# Patient Record
Sex: Female | Born: 1935 | Race: White | Hispanic: No | Smoking: Never smoker
Health system: Southern US, Community
[De-identification: ages and names within clinical notes are randomized; demographics above are authoritative.]

## PROBLEM LIST (undated history)

## (undated) DIAGNOSIS — E785 Hyperlipidemia, unspecified: Secondary | ICD-10-CM

## (undated) DIAGNOSIS — I10 Essential (primary) hypertension: Secondary | ICD-10-CM

---

## 2004-05-29 ENCOUNTER — Other Ambulatory Visit: Payer: Self-pay

## 2005-03-10 ENCOUNTER — Emergency Department: Payer: Self-pay | Admitting: Emergency Medicine

## 2015-08-19 DIAGNOSIS — E782 Mixed hyperlipidemia: Secondary | ICD-10-CM | POA: Insufficient documentation

## 2015-08-19 DIAGNOSIS — E785 Hyperlipidemia, unspecified: Secondary | ICD-10-CM | POA: Insufficient documentation

## 2016-08-19 DIAGNOSIS — Z0000 Encounter for general adult medical examination without abnormal findings: Secondary | ICD-10-CM | POA: Insufficient documentation

## 2020-01-24 ENCOUNTER — Ambulatory Visit: Payer: Self-pay | Attending: Internal Medicine

## 2020-01-24 DIAGNOSIS — Z20822 Contact with and (suspected) exposure to covid-19: Secondary | ICD-10-CM

## 2020-01-26 LAB — NOVEL CORONAVIRUS, NAA: SARS-CoV-2, NAA: NOT DETECTED

## 2020-03-23 ENCOUNTER — Emergency Department
Admission: EM | Admit: 2020-03-23 | Discharge: 2020-03-23 | Disposition: A | Discharge status: Home / Self Care | Payer: Medicare Other | Attending: Emergency Medicine | Admitting: Emergency Medicine

## 2020-03-23 ENCOUNTER — Emergency Department: Payer: Medicare Other

## 2020-03-23 ENCOUNTER — Other Ambulatory Visit: Payer: Self-pay

## 2020-03-23 DIAGNOSIS — M19031 Primary osteoarthritis, right wrist: Secondary | ICD-10-CM | POA: Diagnosis not present

## 2020-03-23 DIAGNOSIS — I10 Essential (primary) hypertension: Secondary | ICD-10-CM | POA: Diagnosis not present

## 2020-03-23 DIAGNOSIS — M25531 Pain in right wrist: Secondary | ICD-10-CM | POA: Insufficient documentation

## 2020-03-23 DIAGNOSIS — M25512 Pain in left shoulder: Secondary | ICD-10-CM | POA: Diagnosis present

## 2020-03-23 HISTORY — DX: Hyperlipidemia, unspecified: E78.5

## 2020-03-23 HISTORY — DX: Essential (primary) hypertension: I10

## 2020-03-23 NOTE — ED Triage Notes (Signed)
Patient reports putting up artificial christmas trees; reports she had to squeeze the lights/tree strenuously. Patient reports continued pain right hand since.

## 2020-03-23 NOTE — ED Notes (Signed)
pts wrist/hand wrapped with ace bandage. Pt very anxious about how she will manage care of husband with bandage, pt instructed and pt demonstrated proper technique for applying and removing bandage. Concerns addressed with MD Scotty Court and no new orders at this time

## 2020-03-23 NOTE — ED Provider Notes (Signed)
[facility] Emergency Department Provider Note  ____________________________________________  Time seen: Approximately 4:15 AM  I have reviewed the triage vital signs and the nursing notes.   HISTORY  Chief Complaint Hand Injury    HPI Rose Wilkins is a 84 y.o. female who complains of pain in the left shoulder and the right wrist.  She was in her usual state of health until 3 days ago with she was putting away some artificial Christmas trees.  This required repetitive movements to bend the wires and compact the decorations so that they could be put away in their box.   After putting them away, several hours later in the evening she started developing soreness in the right wrist and the left shoulder.  Denies any falls or acute injuries.  Denies fevers or chills.  Symptoms are constant, worse with movement, no alleviating factors.     Past Medical History:  Diagnosis Date  . Hyperlipidemia   . Hypertension      There are no problems to display for this patient.    History reviewed. No pertinent surgical history.   Prior to Admission medications   Not on File     Allergies Sulfa antibiotics   No family history on file.  Social History Social History   Tobacco Use  . Smoking status: Never Smoker  . Smokeless tobacco: Never Used  Substance Use Topics  . Alcohol use: Never  . Drug use: Not on file    Review of Systems  Constitutional:   No fever or chills.  ENT:   No sore throat. No rhinorrhea. Cardiovascular:   No chest pain or syncope. Respiratory:   No dyspnea or cough. Gastrointestinal:   Negative for abdominal pain, vomiting and diarrhea.  Musculoskeletal:   Right wrist pain and swelling as above.  Left shoulder pain. All other systems reviewed and are negative except as documented above in ROS and HPI.  ____________________________________________   PHYSICAL EXAM:  VITAL SIGNS: ED Triage Vitals  Enc Vitals Group    BP 03/23/20 0153 (!) 131/49     Pulse Rate 03/23/20 0153 79     Resp 03/23/20 0153 18     Temp 03/23/20 0153 97.6 F (36.4 C)     Temp Source 03/23/20 0153 Oral     SpO2 03/23/20 0153 97 %     Weight 03/23/20 0153 136 lb (61.7 kg)     Height 03/23/20 0153 5\' 6"  (1.676 m)     Head Circumference --      Peak Flow --      Pain Score 03/23/20 0157 2     Pain Loc --      Pain Edu? --      Excl. in [place]? --     Vital signs reviewed, nursing assessments reviewed.   Constitutional:   Alert and oriented. Non-toxic appearance. Eyes:   Conjunctivae are normal. EOMI. ENT      Head:   Normocephalic and atraumatic.      Mouth/Throat:   MMM      Neck:   No meningismus. Full ROM. Cardiovascular:   RRR. Symmetric bilateral radial and DP pulses.  Cap refill less than 2 seconds. Respiratory:   Normal respiratory effort without tachypnea/retractions. Musculoskeletal:   Normal range of motion in all extremities.  There is watery edema around the right wrist without inflammatory changes.  No wounds.  Phalen and Tinel sign not present, good capillary refill, soft compartments Neurologic:   Normal speech and  language.  Motor grossly intact. No acute focal neurologic deficits are appreciated.  Skin:    Skin is warm, dry and intact. No rash noted.  No wounds.  ____________________________________________    LABS (pertinent positives/negatives) (all labs ordered are listed, but only abnormal results are displayed) Labs Reviewed - No data to display ____________________________________________   EKG  ____________________________________________    RADIOLOGY  DG Hand Complete Right  Result Date: 03/23/2020 CLINICAL DATA:  First metacarpal pain and swelling with erythema. EXAM: RIGHT HAND - COMPLETE 3+ VIEW COMPARISON:  None. FINDINGS: There is narrowing of the first carpometacarpal joint with surrounding calcification/osteophytosis. The bones are generally osteopenic. There is no acute fracture  or dislocation. The other joint spaces are unremarkable. IMPRESSION: First carpometacarpal joint space narrowing with adjacent calcification/osteophytosis, likely indicating osteoarthritis. Electronically Signed   By: Deatra Robinson M.D.   On: 03/23/2020 02:41    ____________________________________________   PROCEDURES Procedures  ____________________________________________  CLINICAL IMPRESSION / ASSESSMENT AND PLAN / ED COURSE  Pertinent labs & imaging results that were available during my care of the patient were reviewed by me and considered in my medical decision making (see chart for details).  Rose Wilkins was evaluated in Emergency Department on 03/23/2020 for the symptoms described in the history of present illness. She was evaluated in the context of the global COVID-19 pandemic, which necessitated consideration that the patient might be at risk for infection with the SARS-CoV-2 virus that causes COVID-19. Institutional protocols and algorithms that pertain to the evaluation of patients at risk for COVID-19 are in a state of rapid change based on information released by regulatory bodies including the CDC and federal and state organizations. These policies and algorithms were followed during the patient's care in the ED.   Patient presents with pain and swelling of the right wrist after repetitive stress.  Consistent with arthritis/overuse.  Not consistent with infection or compartment syndrome.  Hand x-ray reviewed, unremarkable.  She is taking Aleve at home which she can continue.  Recommend Ace wrap, intermittent ice.      ____________________________________________   FINAL CLINICAL IMPRESSION(S) / ED DIAGNOSES    Final diagnoses:  Arthritis of right wrist     ED Discharge Orders    None      Portions of this note were generated with dragon dictation software. Dictation errors may occur despite best attempts at proofreading.   Sharman Cheek, MD 03/23/20  [phone]

## 2020-03-27 ENCOUNTER — Encounter: Payer: Self-pay | Admitting: *Deleted

## 2020-03-27 ENCOUNTER — Emergency Department: Payer: Medicare Other

## 2020-03-27 ENCOUNTER — Other Ambulatory Visit: Payer: Self-pay

## 2020-03-27 DIAGNOSIS — M79642 Pain in left hand: Secondary | ICD-10-CM | POA: Diagnosis present

## 2020-03-27 DIAGNOSIS — I10 Essential (primary) hypertension: Secondary | ICD-10-CM | POA: Insufficient documentation

## 2020-03-27 DIAGNOSIS — E871 Hypo-osmolality and hyponatremia: Secondary | ICD-10-CM | POA: Diagnosis not present

## 2020-03-27 DIAGNOSIS — L03114 Cellulitis of left upper limb: Secondary | ICD-10-CM | POA: Insufficient documentation

## 2020-03-27 DIAGNOSIS — J449 Chronic obstructive pulmonary disease, unspecified: Secondary | ICD-10-CM | POA: Insufficient documentation

## 2020-03-27 LAB — LACTIC ACID, PLASMA: Lactic Acid, Venous: 1.6 mmol/L (ref 0.5–1.9)

## 2020-03-27 MED ORDER — SODIUM CHLORIDE 0.9% FLUSH: 3.0000 mL | Freq: Once | INTRAVENOUS | Status: DC

## 2020-03-27 NOTE — ED Triage Notes (Signed)
Pt brought in by her daughter, seen at urgent care for redness and swelling to the left hand, states started after putting up christmas tree limbs last week. Notable redness extends up the left forearm, denies fevers. On lab review at [facility], pt sodium level 123, sent here for further eval (urine, bmp, and cbc labs sent with pt) Pt daughter says pt has been more confused and anxious lately than normal

## 2020-03-28 ENCOUNTER — Emergency Department
Admission: EM | Admit: 2020-03-28 | Discharge: 2020-03-28 | Disposition: A | Discharge status: Home / Self Care | Payer: Medicare Other | Attending: Emergency Medicine | Admitting: Emergency Medicine

## 2020-03-28 DIAGNOSIS — E871 Hypo-osmolality and hyponatremia: Secondary | ICD-10-CM

## 2020-03-28 DIAGNOSIS — L03114 Cellulitis of left upper limb: Secondary | ICD-10-CM

## 2020-03-28 LAB — BASIC METABOLIC PANEL
Anion gap: 9 (ref 5–15)
BUN: 22 mg/dL (ref 8–23)
CO2: 20 mmol/L — ABNORMAL LOW (ref 22–32)
Calcium: 7.9 mg/dL — ABNORMAL LOW (ref 8.9–10.3)
Chloride: 96 mmol/L — ABNORMAL LOW (ref 98–111)
Creatinine, Ser: 1.04 mg/dL — ABNORMAL HIGH (ref 0.44–1.00)
GFR calc Af Amer: 58 mL/min — ABNORMAL LOW (ref >60)
GFR calc non Af Amer: 50 mL/min — ABNORMAL LOW (ref >60)
Glucose, Bld: 102 mg/dL — ABNORMAL HIGH (ref 70–99)
Potassium: 4.1 mmol/L (ref 3.5–5.1)
Sodium: 125 mmol/L — ABNORMAL LOW (ref 135–145)

## 2020-03-28 MED ORDER — CEPHALEXIN 500 MG PO CAPS: 500.0000 mg | ORAL_CAPSULE | Freq: Four times a day (QID) | ORAL | 0 refills | Status: AC

## 2020-03-28 MED ORDER — CEPHALEXIN 500 MG PO CAPS
500.0000 mg | ORAL_CAPSULE | Freq: Once | ORAL | Status: AC
  Administered 2020-03-28: 01:00:00 500 mg via ORAL
  Filled 2020-03-28: qty 1

## 2020-03-28 MED ORDER — SODIUM CHLORIDE 0.9 % IV BOLUS
1000.0000 mL | Freq: Once | INTRAVENOUS | Status: AC
  Administered 2020-03-28: 1000 mL via INTRAVENOUS

## 2020-03-28 MED ORDER — DEXAMETHASONE SODIUM PHOSPHATE 10 MG/ML IJ SOLN
10.0000 mg | Freq: Once | INTRAMUSCULAR | Status: AC
  Administered 2020-03-28: 01:00:00 10 mg via INTRAVENOUS
  Filled 2020-03-28: qty 1

## 2020-03-28 NOTE — ED Provider Notes (Signed)
[facility] Emergency Department Provider Note  ____________________________________________   First MD Initiated Contact with Patient 03/28/20 0036     (approximate)  I have reviewed the triage vital signs and the nursing notes.   HISTORY  Chief Complaint Abnormal Labs    HPI ANAHLA BEVIS is a 84 y.o. female who presents from urgent care for further evaluation of left hand pain, redness, and swelling as well as abnormal labs at the urgent care.  Her daughter is with her at bedside and also provides history.  She reports that she has gradually developed the redness and swelling over the last week.  She was seen in this emergency department a few days ago for similar symptoms but without the redness on her right hand.  She was diagnosed with arthritis but then after she was last seen for the right hand pain she developed the symptoms in the left.  It is gotten notably worse over the last 24 to 48 hours.  The pain is mild but the redness and swelling extend about halfway up her forearm.  She denies fever, sore throat, nausea, vomiting, chest pain, shortness of breath, cough, and abdominal pain.  She has also had no dysuria.  She has had fatigue for the last few weeks and her daughter says it is because she is exhausting herself caring for the patient's spouse and not getting adequate sleep.  She also suffers from anxiety.  She has seen her primary care doctor, Dr. Dondra Prader , who has encouraged her to rest and prescribed her trazodone to help her sleep, but she is reluctant to take it because she wants to be available and ready to help her husband.  Of note, lab work including CBC, metabolic panel, and urinalysis were performed at the urgent care.  There were no significant abnormalities other than some proteinuria but most notably a sodium level of 123.  The last labs on record were from 6 months ago when she was only slightly decreased at 133.  She said it is  normal for her to be in the range of 133 but 123 is low for her.  Nothing in particular makes her symptoms better or worse and she admits to being quite anxious.  She did not have a puncture wound to her hand and has no laceration or other injury.  She has no history of gout.        Past Medical History:  Diagnosis Date  . Hyperlipidemia   . Hypertension     There are no problems to display for this patient.   History reviewed. No pertinent surgical history.  Prior to Admission medications   Medication Sig Start Date End Date Taking? Authorizing Provider  cephALEXin (KEFLEX) 500 MG capsule Take 1 capsule (500 mg total) by mouth 4 (four) times daily for 7 days. 03/28/20 04/04/20  Loleta Rose, MD    Allergies Sulfa antibiotics  No family history on file.  Social History Social History   Tobacco Use  . Smoking status: Never Smoker  . Smokeless tobacco: Never Used  Substance Use Topics  . Alcohol use: Never  . Drug use: Not on file    Review of Systems Constitutional: No fever/chills.  Fatigue but she denies generalized weakness.  Admits to insomnia or poor sleeping habits. Eyes: No visual changes. ENT: No sore throat. Cardiovascular: Denies chest pain. Respiratory: Denies shortness of breath. Gastrointestinal: No abdominal pain.  No nausea, no vomiting.  No diarrhea.  No constipation.  Genitourinary: Negative for dysuria. Musculoskeletal: Pain, swelling, and redness of the left hand extending about halfway up the forearm. Integumentary: Redness and swelling of left hand extending about halfway up the forearm. Neurological: Negative for headaches, focal weakness or numbness.   ____________________________________________   PHYSICAL EXAM:  VITAL SIGNS: ED Triage Vitals [03/27/20 1912]  Enc Vitals Group     BP (!) 172/77     Pulse Rate 75     Resp (!) 24     Temp 98.1 F (36.7 C)     Temp Source Oral     SpO2 99 %     Weight      Height      Head  Circumference      Peak Flow      Pain Score 4     Pain Loc      Pain Edu?      Excl. in [place]?     Constitutional: Alert and oriented.  Generally well-appearing and in no acute distress. Eyes: Conjunctivae are normal.  Head: Atraumatic. Nose: No congestion/rhinnorhea. Mouth/Throat: Patient is wearing a mask. Neck: No stridor.  No meningeal signs.   Cardiovascular: Normal rate, regular rhythm. Good peripheral circulation. Grossly normal heart sounds. Respiratory: Normal respiratory effort.  No retractions. Gastrointestinal: Soft and nontender. No distention.  Musculoskeletal: Right hand and arm appear normal.  The left hand is edematous and erythematous and the erythema and trace edema extend up to about halfway up the forearm, including about 1/4 to 1/3 of the limb overall but considerably less than 50%.  No significant tenderness to palpation, compartments are soft and easily compressible, no concern for flexor tenosynovitis, easily able to flex and extend her hand and fingers and wrist and has good grip strength.  No open wounds no evidence of wound infection. Neurologic:  Normal speech and language. No gross focal neurologic deficits are appreciated.  Skin:  Skin is warm, dry and intact.  See musculoskeletal exam above. Psychiatric: Mood and affect are normal. Speech and behavior are normal.  ____________________________________________   LABS (all labs ordered are listed, but only abnormal results are displayed)  Labs Reviewed  BASIC METABOLIC PANEL - Abnormal; Notable for the following components:      Result Value   Sodium 125 (*)    Chloride 96 (*)    CO2 20 (*)    Glucose, Bld 102 (*)    Creatinine, Ser 1.04 (*)    Calcium 7.9 (*)    GFR calc non Af Amer 50 (*)    GFR calc Af Amer 58 (*)    All other components within normal limits  LACTIC ACID, PLASMA   ____________________________________________  EKG  No indication for emergent  EKG ____________________________________________  RADIOLOGY I, Loleta Rose, personally viewed and evaluated these images (plain radiographs) as part of my medical decision making, as well as reviewing the written report by the radiologist.  ED MD interpretation: COPD on chest x-ray but no evidence of acute abnormality.  Marked first CMC degenerative changes on left hand x-rays without any other bony changes nor soft tissue concerns.  Official radiology report(s): DG Chest 2 View  Result Date: 03/27/2020 CLINICAL DATA:  Left hand infection. EXAM: CHEST - 2 VIEW COMPARISON:  None. FINDINGS: Lungs are hyperinflated. Mild, chronic appearing increased lung markings are seen without evidence of acute infiltrate, pleural effusion or pneumothorax. The heart size and mediastinal contours are within normal limits. There is mild calcification of the aortic arch. There is moderate severity  dextroscoliosis of the lower thoracic spine. IMPRESSION: 1. Findings consistent with COPD. 2. No acute or active cardiopulmonary disease. Electronically Signed   By: Aram Candela M.D.   On: 03/27/2020 20:38   DG Hand Complete Left  Result Date: 03/27/2020 CLINICAL DATA:  Hand tenderness and swelling. EXAM: LEFT HAND - COMPLETE 3+ VIEW COMPARISON:  March 23, 2020, contralateral hand. FINDINGS: Marked first CMC degenerative changes are similar to the contralateral hand. No signs of bony erosion or acute bone finding. Osteopenia. IMPRESSION: Marked first CMC degenerative changes likely osteoarthritis in similar to the contralateral hand. No acute findings. Electronically Signed   By: Donzetta Kohut M.D.   On: 03/27/2020 20:45    ____________________________________________   PROCEDURES   Procedure(s) performed (including Critical Care):  Procedures   ____________________________________________   INITIAL IMPRESSION / MDM / ASSESSMENT AND PLAN / ED COURSE  As part of my medical decision making, I reviewed the  following data within the electronic MEDICAL RECORD NUMBER History obtained from family, Nursing notes reviewed and incorporated, Labs reviewed , reviewed radiographs, EKG interpreted , Old chart reviewed and Notes from prior ED visits   Differential diagnosis includes, but is not limited to, hyponatremia due to volume depletion or other more chronic issue, acute infection such as cellulitis, gout, polyarticular arthritis.  The patient is overall generally well-appearing with stable vital signs.  She technically has a very mild leukocytosis of 10.3 and although this is just barely over the limit of normal, looking back through her records I see that her baseline is a white blood cell count around 5.  Her hemoglobin is a little bit lower than usual but she has no concerns for acute anemia.  Metabolic panel is normal other than her sodium of 123 which is decreased substantially from prior.  I suspect this is mostly volume related given the stress she is under, poor oral intake and sleeping habits, etc.  The patient does not want to stay in the hospital if it is not necessary and I think that would actually be worse for her than being able to be treated as an outpatient.  Although she may very well have a degree of cellulitis in the left hand and arm, she does not appear to need inpatient treatment and I discussed with the patient and her daughter the plan for empiric treatment of cellulitis to make sure it does not get worse if in fact that is what is going on with her.  They agree that that is appropriate to try.  I will give her a dose of Keflex 500 mg by mouth as well as a dose of Decadron 10 mg IV to help with the inflammation as well as to help proliferation of the cellulitis if in fact that is what is going on with her.  She does not have diabetes so hyperglycemia should not be a significant concern.  I am giving her 1 L normal saline IV bolus and then will check a metabolic panel to see if her sodium has come up.   I anticipate discharge and outpatient follow-up with Dr. Hyacinth Meeker but will reassess the patient after her lab work.      Clinical Course as of Mar 28 332  Sat Mar 28, 2020  4128 Patient sodium has come up slightly to 125.  It is still low but she is generally symptomatic from it.  I touch base with her and she very much wants to go home which I think is appropriate, particularly  since she has family that can help look after her.  I encouraged her to take a salt tablet daily (she said she already has some at home) and to eat and drink plenty of fluids and to not try to salt restrict herself.  She will follow up with Dr. [name] Heck on Monday.  I also went over with her again the need to take the antibiotics as prescribed and to take all her regular medications as scheduled this morning.  I gave strict return precautions and she understands and agrees with the plan.   [CF]    Clinical Course User Index [CF] Hinda Kehr, MD     ____________________________________________  FINAL CLINICAL IMPRESSION(S) / ED DIAGNOSES  Final diagnoses:  Left arm cellulitis  Hyponatremia     MEDICATIONS GIVEN DURING THIS VISIT:  Medications  sodium chloride flush (NS) 0.9 % injection 3 mL (has no administration in time range)  cephALEXin (KEFLEX) capsule 500 mg (500 mg Oral Given 03/28/20 0126)  sodium chloride 0.9 % bolus 1,000 mL (0 mLs Intravenous Stopped 03/28/20 0251)  dexamethasone (DECADRON) injection 10 mg (10 mg Intravenous Given 03/28/20 0126)     ED Discharge Orders         Ordered    cephALEXin (KEFLEX) 500 MG capsule  4 times daily     03/28/20 0330          *Please note:  JAYLYNN MCALEER was evaluated in Emergency Department on 03/28/2020 for the symptoms described in the history of present illness. She was evaluated in the context of the global COVID-19 pandemic, which necessitated consideration that the patient might be at risk for infection with the SARS-CoV-2 virus that causes  COVID-19. Institutional protocols and algorithms that pertain to the evaluation of patients at risk for COVID-19 are in a state of rapid change based on information released by regulatory bodies including the CDC and federal and state organizations. These policies and algorithms were followed during the patient's care in the ED.  Some ED evaluations and interventions may be delayed as a result of limited staffing during the pandemic.*  Note:  This document was prepared using Dragon voice recognition software and may include unintentional dictation errors.   Hinda Kehr, MD 03/28/20 [phone]

## 2020-03-28 NOTE — Discharge Instructions (Signed)
As we discussed, your work-up is generally reassuring.  We are treating you for a possible infection in your left hand and arm (cellulitis) with a course of antibiotics.  I encourage you to take it for the full 7 days (I originally told you it would be 10 days but I think we can give you a shorter course of 7 days and have it still be effective).  Take all of your regular medications as well starting with your morning doses today.  I also encourage you to be sure and drink plenty of fluids and eat a normal diet even if you do not have much of an appetite.  I also recommend you take one of your salt tablets daily.  Follow-up with Dr. Hyacinth Meeker on Monday for the next available appointment.  If you develop any new or worsening symptoms that concern you, please return immediately to the emergency department.

## 2020-10-19 ENCOUNTER — Ambulatory Visit (INDEPENDENT_AMBULATORY_CARE_PROVIDER_SITE_OTHER): Payer: Medicare Other | Admitting: Podiatry

## 2020-10-19 ENCOUNTER — Other Ambulatory Visit: Payer: Self-pay

## 2020-10-19 ENCOUNTER — Encounter: Payer: Self-pay | Admitting: Podiatry

## 2020-10-19 DIAGNOSIS — B351 Tinea unguium: Secondary | ICD-10-CM | POA: Diagnosis not present

## 2020-10-19 DIAGNOSIS — M79675 Pain in left toe(s): Secondary | ICD-10-CM

## 2020-10-19 DIAGNOSIS — I493 Ventricular premature depolarization: Secondary | ICD-10-CM | POA: Insufficient documentation

## 2020-10-19 DIAGNOSIS — I10 Essential (primary) hypertension: Secondary | ICD-10-CM | POA: Insufficient documentation

## 2020-10-19 DIAGNOSIS — M79674 Pain in right toe(s): Secondary | ICD-10-CM | POA: Diagnosis not present

## 2020-10-19 DIAGNOSIS — K2100 Gastro-esophageal reflux disease with esophagitis, without bleeding: Secondary | ICD-10-CM | POA: Insufficient documentation

## 2020-10-19 NOTE — Progress Notes (Signed)
This patient presents  to the office for evaluation and treatment of long thick painful big  nails .  This patient is unable to trim her own nails since the patient cannot reach her feet.  Patient says the nails are painful walking and wearing her shoes.  She returns for preventive foot care services.  General Appearance  Alert, conversant and in no acute stress.  Vascular  Dorsalis pedis and posterior tibial  pulses are palpable  bilaterally.  Capillary return is within normal limits  bilaterally. Temperature is within normal limits  bilaterally.  Neurologic  Senn-Weinstein monofilament wire test within normal limits  bilaterally. Muscle power within normal limits bilaterally.  Nails Thick disfigured discolored nails with subungual debris  Hallux  bilaterally. No evidence of bacterial infection or drainage bilaterally.  Orthopedic  No limitations of motion  feet .  No crepitus or effusions noted.  No bony pathology or digital deformities noted. HAV  B/L.  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  Midfoot  DJD  B/L.   Skin  normotropic skin with no porokeratosis noted bilaterally.  No signs of infections or ulcers noted.     Onychomycosis  Pain in toes right foot  Pain in toes left foot  Debridement  of hallux nails   B/L with a nail nipper.  Nails were then filed using a dremel tool with no incidents.    RTC  3 months    Helane Gunther DPM

## 2021-01-25 ENCOUNTER — Ambulatory Visit: Payer: Medicare Other | Admitting: Podiatry

## 2021-07-21 ENCOUNTER — Other Ambulatory Visit: Payer: Self-pay

## 2021-07-21 ENCOUNTER — Ambulatory Visit (INDEPENDENT_AMBULATORY_CARE_PROVIDER_SITE_OTHER): Payer: Medicare Other | Admitting: Licensed Clinical Social Worker

## 2021-07-21 DIAGNOSIS — F4323 Adjustment disorder with mixed anxiety and depressed mood: Secondary | ICD-10-CM

## 2021-07-21 DIAGNOSIS — Z634 Disappearance and death of family member: Secondary | ICD-10-CM

## 2021-07-21 NOTE — Progress Notes (Signed)
   THERAPIST PROGRESS NOTE  Session Time: 2-3p  Participation Level: Active  Behavioral Response: NeatAlertAnxious and Depressed  Type of Therapy: Individual Therapy  Treatment Goals addressed: Anxiety and Diagnosis: depression  Interventions: Other: Assessment and treatment plan  Summary: Rose Wilkins is a 85 y.o. female who presents with symptoms associated with adjustment disorder. Pt also reports that her symptoms have continued since husband's death in 04/11/2020.  Pt reports that overall mood fluctuates and that she gets good quality and quantity of sleep.  Pt reports a recent 30 lb weight loss since loss of husband. Pt reports that she feels she worries over everything. Pts daughter is good support system for her and they talk on a regular basis.   Pt wants to manage depression and anxiety symptoms better and process through thoughts and feelings associated with loss of husband and coping with physical limitations. Pt used to be a physical Automotive engineer and feels that she is unable to do many activities that have been therapeutic in the past.   Continued recommendations are as follows: self care behaviors, positive social engagements, focusing on overall work/home/life balance, and focusing on positive physical and emotional wellness.    Suicidal/Homicidal: Yes--pt expressed thoughts that sometimes she wishes she were not alive. Pt reiterates that she has no plans or intent on following through with taking her own life.   Therapist Response: Assessment/development of treatment plan. Provided pt with psychoeducational materials/coping skills for anxiety and depression.   Plan: Return again in 4 weeks.  Diagnosis: Axis I: Adjustment disorder with mixed anxiety and depressed mood; Bereavement.    Axis II: No diagnosis    Ernest Haber Nataya Bastedo, LCSW 07/21/2021

## 2021-08-25 ENCOUNTER — Ambulatory Visit (INDEPENDENT_AMBULATORY_CARE_PROVIDER_SITE_OTHER): Payer: Medicare Other | Admitting: Licensed Clinical Social Worker

## 2021-08-25 ENCOUNTER — Other Ambulatory Visit: Payer: Self-pay

## 2021-08-25 DIAGNOSIS — Z634 Disappearance and death of family member: Secondary | ICD-10-CM

## 2021-08-25 DIAGNOSIS — F4323 Adjustment disorder with mixed anxiety and depressed mood: Secondary | ICD-10-CM | POA: Diagnosis not present

## 2021-08-25 NOTE — Plan of Care (Signed)
  Problem: Depression CCP Problem  1 : Improve overall mood Intervention: Review PLEASE Skills (Treat Physical Illness, Balance Eating, Avoid Mood-Altering Substances, Balance Sleep and Get Exercise) with @PREFFIRSTNAME @

## 2021-08-25 NOTE — Plan of Care (Signed)
  Problem: Depression CCP Problem  1 : Improve overall mood Goal: LTG: @PREFFIRSTNAME @ will score less than 10 on the Patient Health Questionnaire (PHQ-9) Outcome: Progressing   Problem: Depression CCP Problem  1 : Improve overall mood Goal: STG: @PREFFIRSTNAME @ will participate in at least 80% of scheduled individual psychotherapy sessions Outcome: Progressing   Problem: Depression CCP Problem  1 : Improve overall mood Intervention: Review PLEASE Skills (Treat Physical Illness, Balance Eating, Avoid Mood-Altering Substances, Balance Sleep and Get Exercise) with @PREFFIRSTNAME @

## 2021-08-25 NOTE — Progress Notes (Signed)
   THERAPIST PROGRESS NOTE  Session Time: 1-2p  Participation Level: Active  Behavioral Response: Neat and Well GroomedAlertAnxious and Depressed  Type of Therapy: Individual Therapy  Treatment Goals addressed: Anxiety and Coping  Interventions: CBT and Supportive  Summary: Rose Wilkins is a 85 y.o. female who presents with continuing symptoms related to adjustment disorder diagnosis.  Pt reports that overall mood has been stable on most days but pt feels down on some days. Pt reports that she often feels she has low mood and low motivation. Pt feels that neuropathy in her hands keeps her from being able to do things that she typically enjoys. Discussed activities and things that pt enjoys. Pt feels that she is not engaging with others like she should "I don't want to hear the same thing over and over all the time". Pt reports that eating is difficult "opening cans with my hands is hard". Pt reports that PCP is managing psychiatric medication--encouraged pt to have a conversation with her PCP at next visit about mood and medication. Discussed relationships with daughter and sisters. Allowed daughter to come into session to discuss concerns about mother and discuss interventions that could be helpful. Daughter requests more frequent visits--will schedule visits in one month and will put on call list for virtual appt in between in person visits. Continued recommendations are as follows: self care behaviors, positive social engagements, focusing on overall work/home/life balance, and focusing on positive physical and emotional wellness.    Suicidal/Homicidal: No  Therapist Response: Soul is able to identify coping mechanisms that have been helpful in the past and increase its use. Pt is able to increase understanding of beliefs and messages that produce the worry and anxiety. Pt is continuing through her grief process/path. Pt is trying hard to develop the ability to recognize, accept, and cope  with feelings of depression. These behaviors are reflective of both personal growth and progress. Treatment to continue as indicated.  Plan: Return again in 4 weeks.  Diagnosis: Axis I: Adjustment disorder with mixed anxiety and depressed mood    Axis II: No diagnosis    Ernest Haber Leiani Enright, LCSW 08/25/2021

## 2021-08-25 NOTE — Plan of Care (Signed)
Developed plan 

## 2021-10-06 ENCOUNTER — Other Ambulatory Visit: Payer: Self-pay

## 2021-10-06 ENCOUNTER — Ambulatory Visit (INDEPENDENT_AMBULATORY_CARE_PROVIDER_SITE_OTHER): Payer: Medicare Other | Admitting: Licensed Clinical Social Worker

## 2021-10-06 DIAGNOSIS — F4323 Adjustment disorder with mixed anxiety and depressed mood: Secondary | ICD-10-CM

## 2021-10-06 NOTE — Progress Notes (Signed)
   THERAPIST PROGRESS NOTE  Session Time: 110-140p  Participation Level: Active  Behavioral Response: NeatAlertAnxious  Type of Therapy: Individual Therapy  Treatment Goals addressed:  Goal: LTG: @PREFFIRSTNAME @ will score less than 10 on the Patient Health Questionnaire (PHQ-9) Outcome: Progressing  Goal: STG: @PREFFIRSTNAME @ will participate in at least 80% of scheduled individual psychotherapy sessions Outcome: Progressing Interventions:  Intervention: REVIEW PLEASE SKILLS (TREAT PHYSICAL ILLNESS, BALANCE EATING, AVOID MOOD-ALTERING SUBSTANCES, BALANCE SLEEP AND GET EXERCISE) WITH [place]  Intervention: Discuss self-management skills  Intervention: Assist with coping skills and behavior  Intervention: Encourage changes to improve social interaction  Summary: DAINE CROKER is a 85 y.o. female who presents with improving symptoms related to adjustment disorder. Pt reports that overall mood is stable and that she is managing situational stressors well. Pt is reporting improvements in her overall hand neuropathy symptoms. Pt is taking lyrica and feels that it is starting to work better. Pt reports that she is engaging more socially--pt is going to bible studies on a regular basis and is getting out of the house more.  Pt reports that she is engaging with family members more--talking with daughter and sisters on a regular basis. Pt is excited about attending a wedding this coming weekend.   Discussed coping mechanisms for when pt feels depressed and/or anxious. Reviewed sleep hygiene.  Continued recommendations are as follows: self care behaviors, positive social engagements, focusing on overall work/home/life balance, and focusing on positive physical and emotional wellness.   Suicidal/Homicidal: No  Therapist Response: Pt is continuing to apply interventions learned in session into daily life situations. Pt is currently on track to meet goals utilizing interventions mentioned  above. Personal growth and progress noted. Treatment to continue as indicated.   Plan: Return again in 4 weeks.  Diagnosis: Axis I: Adjustment disorder with depressed mood    Axis II: No diagnosis    [place] Jahzara Slattery, LCSW 10/06/2021

## 2021-10-11 NOTE — Plan of Care (Signed)
  Problem: Depression CCP Problem  1 : Improve overall mood Goal: LTG: @PREFFIRSTNAME @ will score less than 10 on the Patient Health Questionnaire (PHQ-9) Outcome: Progressing Goal: STG: @PREFFIRSTNAME @ will participate in at least 80% of scheduled individual psychotherapy sessions Outcome: Progressing Intervention: Work with @PREFFIRSTNAME @ to identify the major components of a recent episode of depression: physical symptoms, major thoughts and images, and major behaviors they experienced Intervention: REVIEW PLEASE SKILLS (TREAT PHYSICAL ILLNESS, BALANCE EATING, AVOID MOOD-ALTERING SUBSTANCES, BALANCE SLEEP AND GET EXERCISE) WITH [place] Intervention: Discuss self-management skills Intervention: Assist with coping skills and behavior Intervention: Encourage changes to improve social interaction

## 2021-10-11 NOTE — Plan of Care (Signed)
  Problem: Depression CCP Problem  1 : Improve overall mood Goal: LTG: @PREFFIRSTNAME @ will score less than 10 on the Patient Health Questionnaire (PHQ-9) Outcome: Progressing Goal: STG: @PREFFIRSTNAME @ will participate in at least 80% of scheduled individual psychotherapy sessions Outcome: Progressing Intervention: Work with @PREFFIRSTNAME @ to identify the major components of a recent episode of depression: physical symptoms, major thoughts and images, and major behaviors they experienced

## 2021-11-17 ENCOUNTER — Ambulatory Visit (INDEPENDENT_AMBULATORY_CARE_PROVIDER_SITE_OTHER): Payer: Medicare Other | Admitting: Licensed Clinical Social Worker

## 2021-11-17 DIAGNOSIS — F4323 Adjustment disorder with mixed anxiety and depressed mood: Secondary | ICD-10-CM | POA: Diagnosis not present

## 2021-11-17 DIAGNOSIS — Z634 Disappearance and death of family member: Secondary | ICD-10-CM | POA: Diagnosis not present

## 2021-11-17 NOTE — Progress Notes (Signed)
Virtual Visit via Audio Note  I connected with Rose Wilkins on 11/17/21 at  2:00 PM EST by an audio enabled telemedicine application and verified that I am speaking with the correct person using two identifiers.  Location: Patient: home Provider: [facility]    I discussed the limitations of evaluation and management by telemedicine and the availability of in person appointments. The patient expressed understanding and agreed to proceed.    I discussed the assessment and treatment plan with the patient. The patient was provided an opportunity to ask questions and all were answered. The patient agreed with the plan and demonstrated an understanding of the instructions.   The patient was advised to call back or seek an in-person evaluation if the symptoms worsen or if the condition fails to improve as anticipated.  I provided 25 minutes of non-face-to-face time during this encounter.   Rose Freel R Ermagene Saidi, LCSW   THERAPIST PROGRESS NOTE  Session Time: 2-225p  Participation Level: Active  Behavioral Response: NeatAlertAnxious  Type of Therapy: Individual Therapy  Treatment Goals addressed:  Goal: LTG: @PREFFIRSTNAME @ will score less than 10 on the Patient Health Questionnaire (PHQ-9) Outcome: Progressing  Goal: STG: @PREFFIRSTNAME @ will participate in at least 80% of scheduled individual psychotherapy sessions Outcome: Progressing  Interventions:   Intervention: Educate patient on: Stress management Note: Reviewed stress management techniques  Intervention: Assist with relaxation techniques, as appropriate (deep breathing exercises, meditation, guided imagery) Note: Reviewed to help with pain management  Intervention: Assess emotional status and coping mechanisms Note: Assessed: pt reports that depression symptoms have decreased since last session even with recent injury    Summary: Rose Wilkins is a 85 y.o. female who presents with improving symptoms related to  adjustment disorder. Pt reports that overall mood is stable and that she is managing situational stressors well. Pt reports good quality and quantity of sleep.  Pt recently had an arm break while visiting with daughter in [place]. Discussed the entire scenario--pts hospital experience. Pt reports that once pain was controlled, she still was able to participate in activities scheduled and had a good time. Pt reports that her daughter stayed with her for a while to help out around the house but she had to go back home. Pt feels that there are lots of things that need to be done in the house that she can't do. Pt reports that her sister will be helping her with tasks.   Continued recommendations are as follows: self care behaviors, positive social engagements, focusing on overall work/home/life balance, and focusing on positive physical and emotional wellness.   Suicidal/Homicidal: No  Therapist Response: Pt is continuing to apply interventions learned in session into daily life situations. Pt is currently on track to meet goals utilizing interventions mentioned above. Personal growth and progress noted. Treatment to continue as indicated.   Plan: Return again in 4 weeks.  Diagnosis: Axis I: Adjustment disorder with depressed mood    Axis II: No diagnosis    91 Rose Flessner, LCSW 11/17/2021

## 2021-11-17 NOTE — Plan of Care (Signed)
  Problem: Depression CCP Problem  1 : Improve overall mood Goal: LTG: @PREFFIRSTNAME @ will score less than 10 on the Patient Health Questionnaire (PHQ-9) Outcome: Progressing Goal: STG: @PREFFIRSTNAME @ will participate in at least 80% of scheduled individual psychotherapy sessions Outcome: Progressing Intervention: Educate patient on: Stress management Note: Reviewed stress management techniques Intervention: Assist with relaxation techniques, as appropriate (deep breathing exercises, meditation, guided imagery) Note: Reviewed to help with pain management Intervention: Assess emotional status and coping mechanisms Note: Assessed: pt reports that depression symptoms have decreased since last session even with recent injury

## 2021-12-28 ENCOUNTER — Ambulatory Visit: Payer: Medicare Other | Admitting: Licensed Clinical Social Worker

## 2021-12-28 ENCOUNTER — Other Ambulatory Visit: Payer: Self-pay

## 2021-12-28 ENCOUNTER — Ambulatory Visit (INDEPENDENT_AMBULATORY_CARE_PROVIDER_SITE_OTHER): Payer: Medicare Other | Admitting: Licensed Clinical Social Worker

## 2021-12-28 DIAGNOSIS — Z634 Disappearance and death of family member: Secondary | ICD-10-CM

## 2021-12-28 DIAGNOSIS — F4323 Adjustment disorder with mixed anxiety and depressed mood: Secondary | ICD-10-CM | POA: Diagnosis not present

## 2021-12-28 NOTE — Progress Notes (Signed)
Virtual Visit via Audio Note  I connected with Rose Wilkins on 12/28/21 at  3:00 PM EST by an audio enabled telemedicine application and verified that I am speaking with the correct person using two identifiers.  Location: Patient: home Provider: [facility]    I discussed the limitations of evaluation and management by telemedicine and the availability of in person appointments. The patient expressed understanding and agreed to proceed.    I discussed the assessment and treatment plan with the patient. The patient was provided an opportunity to ask questions and all were answered. The patient agreed with the plan and demonstrated an understanding of the instructions.   The patient was advised to call back or seek an in-person evaluation if the symptoms worsen or if the condition fails to improve as anticipated.  I provided 25 minutes of non-face-to-face time during this encounter.   Rose Wilkins Rose Wilkins, Rose Wilkins   THERAPIST PROGRESS NOTE  Session Time: 2-225p  Participation Level: Active  Behavioral Response: NeatAlertAnxious  Type of Therapy: Individual Therapy  Treatment Goals addressed:  Goal: LTG: @PREFFIRSTNAME @ will score less than 10 on the Patient Health Questionnaire (PHQ-9) Outcome: Progressing  Goal: STG: @PREFFIRSTNAME @ will participate in at least 80% of scheduled individual psychotherapy sessions Outcome: Progressing  Interventions:   Intervention: Educate patient on: Stress management Note: Reviewed stress management techniques  Intervention: Assist with relaxation techniques, as appropriate (deep breathing exercises, meditation, guided imagery) Note: Reviewed to help with pain management  Intervention: Assess emotional status and coping mechanisms Note: Assessed: pt reports that depression symptoms have decreased since last session even with recent injury    Summary: Rose Wilkins is a 86 y.o. female who presents with improving symptoms related to  adjustment disorder. Pt reports that overall mood is stable and that she is managing situational stressors well. Pt reports good quality and quantity of sleep.  Allowed pt to explore and express thoughts and feelings associated with recent life situations and external stressors. Pt reports that her broken arm is the primary stressor in life right now. Pt feels more supported than at last session--pt states that her daughter and her sisters are her main support system.   Pt feels she has coping skills to manage anxiety and depression and elects to call back to continue counseling sessions PRN.   Continued recommendations are as follows: self care behaviors, positive social engagements, focusing on overall work/home/life balance, and focusing on positive physical and emotional wellness.   Suicidal/Homicidal: No  Therapist Response: Pt is continuing to apply interventions learned in session into daily life situations. Pt is currently on track to meet goals utilizing interventions mentioned above. Personal growth and progress noted. Treatment to continue as indicated.   Plan: Return again PRN  Diagnosis: Axis I: Adjustment disorder with depressed mood    Axis II: No diagnosis    Rose Wilkins, Rose Wilkins 12/28/2021

## 2022-06-07 ENCOUNTER — Emergency Department: Payer: Medicare Other

## 2022-06-07 ENCOUNTER — Emergency Department
Admission: EM | Admit: 2022-06-07 | Discharge: 2022-06-07 | Disposition: A | Discharge status: Home / Self Care | Payer: Medicare Other | Attending: Emergency Medicine | Admitting: Emergency Medicine

## 2022-06-07 DIAGNOSIS — G5791 Unspecified mononeuropathy of right lower limb: Secondary | ICD-10-CM | POA: Diagnosis not present

## 2022-06-07 DIAGNOSIS — D649 Anemia, unspecified: Secondary | ICD-10-CM | POA: Diagnosis not present

## 2022-06-07 DIAGNOSIS — M25471 Effusion, right ankle: Secondary | ICD-10-CM | POA: Diagnosis present

## 2022-06-07 DIAGNOSIS — N189 Chronic kidney disease, unspecified: Secondary | ICD-10-CM | POA: Diagnosis not present

## 2022-06-07 DIAGNOSIS — M21371 Foot drop, right foot: Secondary | ICD-10-CM

## 2022-06-07 DIAGNOSIS — G629 Polyneuropathy, unspecified: Secondary | ICD-10-CM

## 2022-06-07 DIAGNOSIS — R2689 Other abnormalities of gait and mobility: Secondary | ICD-10-CM | POA: Diagnosis present

## 2022-06-07 LAB — CBC WITH DIFFERENTIAL/PLATELET
Abs Immature Granulocytes: 0 10*3/uL (ref 0.00–0.07)
Basophils Absolute: 0.1 10*3/uL (ref 0.0–0.1)
Basophils Relative: 1 %
Eosinophils Absolute: 0.3 10*3/uL (ref 0.0–0.5)
Eosinophils Relative: 6 %
HCT: 36.1 % (ref 36.0–46.0)
Hemoglobin: 11.8 g/dL — ABNORMAL LOW (ref 12.0–15.0)
Immature Granulocytes: 0 %
Lymphocytes Relative: 33 %
Lymphs Abs: 1.6 10*3/uL (ref 0.7–4.0)
MCH: 31.6 pg (ref 26.0–34.0)
MCHC: 32.7 g/dL (ref 30.0–36.0)
MCV: 96.8 fL (ref 80.0–100.0)
Monocytes Absolute: 0.5 10*3/uL (ref 0.1–1.0)
Monocytes Relative: 11 %
Neutro Abs: 2.4 10*3/uL (ref 1.7–7.7)
Neutrophils Relative %: 49 %
Platelets: 209 10*3/uL (ref 150–400)
RBC: 3.73 MIL/uL — ABNORMAL LOW (ref 3.87–5.11)
RDW: 13.6 % (ref 11.5–15.5)
WBC: 4.9 10*3/uL (ref 4.0–10.5)
nRBC: 0 % (ref 0.0–0.2)

## 2022-06-07 LAB — COMPREHENSIVE METABOLIC PANEL
ALT: 13 U/L (ref 0–44)
AST: 23 U/L (ref 15–41)
Albumin: 3.8 g/dL (ref 3.5–5.0)
Alkaline Phosphatase: 64 U/L (ref 38–126)
Anion gap: 7 (ref 5–15)
BUN: 24 mg/dL — ABNORMAL HIGH (ref 8–23)
CO2: 23 mmol/L (ref 22–32)
Calcium: 9 mg/dL (ref 8.9–10.3)
Chloride: 108 mmol/L (ref 98–111)
Creatinine, Ser: 1.18 mg/dL — ABNORMAL HIGH (ref 0.44–1.00)
GFR, Estimated: 46 mL/min — ABNORMAL LOW (ref >60)
Glucose, Bld: 105 mg/dL — ABNORMAL HIGH (ref 70–99)
Potassium: 4.1 mmol/L (ref 3.5–5.1)
Sodium: 138 mmol/L (ref 135–145)
Total Bilirubin: 0.8 mg/dL (ref 0.3–1.2)
Total Protein: 7.1 g/dL (ref 6.5–8.1)

## 2022-06-07 LAB — VITAMIN B12: Vitamin B-12: 1189 pg/mL — ABNORMAL HIGH (ref 180–914)

## 2022-06-07 LAB — CK: Total CK: 121 U/L (ref 38–234)

## 2022-06-07 LAB — MAGNESIUM: Magnesium: 2.4 mg/dL (ref 1.7–2.4)

## 2022-06-07 NOTE — Evaluation (Signed)
Physical Therapy Evaluation Patient Details Name: Rose Wilkins MRN: [id number] DOB: [birth year] Today's Date: 06/07/2022  History of Present Illness  Rose Wilkins is an 15yoF who comes to [facility] ED after waking up with insidious onset Rt foot drop. Pt aeen by neurology who attributes this to acute peripheral nerve compression injury. Foot drop brace has been ordered and is on patient upon arrival.  Clinical Impression  Pt in bed awaiting PT evaluation, AFO donned and in place, adjusted by author. Educated pt on AFO use and safety. Pt able to AMB around ED unit and perform stairs education. Emphasized need to take extra safety precautions as home until foot drop resolves. Encouraged use of SPC.      Recommendations for follow up therapy are one component of a multi-disciplinary discharge planning process, led by the attending physician.  Recommendations may be updated based on patient status, additional functional criteria and insurance authorization.  Follow Up Recommendations Outpatient PT      Assistance Recommended at Discharge PRN  Patient can return home with the following       Equipment Recommendations Cane  Recommendations for Other Services       Functional Status Assessment Patient has had a recent decline in their functional status and demonstrates the ability to make significant improvements in function in a reasonable and predictable amount of time.     Precautions / Restrictions Precautions Precautions: Fall      Mobility  Bed Mobility Overal bed mobility: Independent                  Transfers Overall transfer level: Needs assistance Equipment used: None Transfers: Sit to/from Stand Sit to Stand: Min guard                Ambulation/Gait Ambulation/Gait assistance: Min guard Gait Distance (Feet): 600 Feet Assistive device: None Gait Pattern/deviations: Decreased dorsiflexion - right          Stairs            Wheelchair  Mobility    Modified Rankin (Stroke Patients Only)       Balance                                             Pertinent Vitals/Pain Pain Assessment Pain Assessment: No/denies pain    Home Living Family/patient expects to be discharged to:: Private residence Living Arrangements: Alone Available Help at Discharge: Family Type of Home: House Home Access: Stairs to enter Entrance Stairs-Rails: None Entrance Stairs-Number of Steps: 1-2   Home Layout: Laundry or work area in [place]: Kasandra Knudsen - single Barista (2 wheels)      Prior Function Prior Level of Function : Independent/Modified Independent;Driving                     Wellsite geologist Comments      Exercises Other Exercises Other Exercises: standing  Rt foot toe taps on gold wipes container 1x15   Assessment/Plan    PT Assessment All further PT needs can be met in the next venue of care  PT Problem List Decreased range of motion;Decreased strength;Decreased balance;Decreased coordination;Decreased knowledge of use of DME       PT Treatment Interventions      PT Goals (Current goals can be found in the Care Plan section)  Acute Rehab PT Goals Patient Stated Goal: not have foot drop PT Goal Formulation: With patient Time For Goal Achievement: 06/21/22 Potential to Achieve Goals: Poor    Frequency       Co-evaluation               AM-PAC PT "6 Clicks" Mobility  Outcome Measure Help needed turning from your back to your side while in a flat bed without using bedrails?: None Help needed moving from lying on your back to sitting on the side of a flat bed without using bedrails?: None Help needed moving to and from a bed to a chair (including a wheelchair)?: A Little Help needed standing  up from a chair using your arms (e.g., wheelchair or bedside chair)?: A Little Help needed to walk in hospital room?: A Little Help needed climbing 3-5 steps with a railing? : A Little 6 Click Score: 20    End of Session   Activity Tolerance: Patient tolerated treatment well;No increased pain Patient left: in bed;with call bell/phone within reach Nurse Communication: Mobility status PT Visit Diagnosis: Unsteadiness on feet (R26.81);Difficulty in walking, not elsewhere classified (R26.2)    Time: 9733-1250 PT Time Calculation (min) (ACUTE ONLY): 20 min   Charges:   PT Evaluation $PT Eval Moderate Complexity: 1 Mod PT Treatments $Gait Training: 8-22 mins       4:57 PM, 06/07/22 Etta Grandchild, PT, DPT Physical Therapist - [facility]  [phone] ([place])    Rulon Abdalla C 06/07/2022, 4:55 PM

## 2022-06-07 NOTE — Discharge Instructions (Addendum)
Please call your neurologist to make a follow-up appointment.  You will need to get an outpatient EMG.  Do not cross your legs and return to the ER if develop any other new symptoms or other concerns. Wear the brace to support the ankle with walking around.

## 2022-06-07 NOTE — ED Triage Notes (Signed)
Patient presents with RIGHT ankle swelling and pain after a fall; States that she got up to walk and "couldn't move" her RIGHT foot and fell; Denies LOC, dizziness; History of neuropathy, says she has a cane/walker that she is suppose to use but does not

## 2022-06-07 NOTE — Consult Note (Signed)
Neurology Consultation Reason for Consult: Leg weakness Referring Physician: Fuller Plan, M  CC: Leg weakness  History is obtained from: Patient  HPI: Rose Wilkins is a 86 y.o. female who made to bed in her normal state of health last night and when she awoke she had difficulty with her right foot.  She typically sleeps with her legs crossed.  She has a history of a peripheral neuropathy as well as bilateral carpal tunnel syndrome but has never had any symptoms such as the one that she presents with today.  She denies any diplopia, nausea, vomiting, weakness or numbness outside of her lower extremities, or other neurological symptoms.   Past Medical History:  Diagnosis Date   Hyperlipidemia    Hypertension      No family history on file.   Social History:  reports that she has never smoked. She has never used smokeless tobacco. She reports that she does not drink alcohol. No history on file for drug use.   Exam: Current vital signs: BP (!) 190/80   Pulse 61   Temp 98.3 F (36.8 C) (Oral)   Resp 19   Ht 5\' 6"  (1.676 m)   Wt 72.6 kg   SpO2 98%   BMI 25.82 kg/m  Vital signs in last 24 hours: Temp:  [98.3 F (36.8 C)] 98.3 F (36.8 C) (06/20 0713) Pulse Rate:  [61] 61 (06/20 0713) Resp:  [19] 19 (06/20 0713) BP: (190)/(80) 190/80 (06/20 0713) SpO2:  [98 %] 98 % (06/20 0713) Weight:  [72.6 kg] 72.6 kg (06/20 0713)   Physical Exam  Constitutional: Appears well-developed and well-nourished.   Neuro: Mental Status: Patient is awake, alert, oriented to person, place, month, year, and situation. Patient is able to give a clear and coherent history. No signs of aphasia or neglect Cranial Nerves: II: Visual Fields are full. Pupils are equal, round, and reactive to light.   III,IV, VI: EOMI without ptosis or diploplia.  V: Facial sensation is symmetric to temperature VII: Facial movement is symmetric.  VIII: hearing is intact to voice X: Uvula elevates symmetrically XI:  Shoulder shrug is symmetric. XII: tongue is midline without atrophy or fasciculations.  Motor: Tone is normal. Bulk is normal. 5/5 strength was present in all four extremities with the exception of dorsiflexion and eversion of the right foot which is severely diminished.  Sensory: Sensation is symmetric to light touch and temperature in the arms. In the legs, she has a decrease to light touch bilaterally just below the knee, with paresthesia. She has less paresthetic sensation(less tinglisness) on the lateral aspect of her right distal leg and foot than on the medial aspect. The paresthetic sensation is circumferentially the same on the left leg.  Deep Tendon Reflexes: 2+ and symmetric in the biceps and patellae. Absent at the ankles bilaterally.  Plantars: Toes are downgoing bilaterally.  Cerebellar: No ataxia   I have reviewed labs in epic and the results pertinent to this consultation are: Mild anemia with hgb of 11.8   Impression: 86 yo F with isolated right foot drop. She sleeps with her legs crossed, and I suspect a peroneal neuropathy. She has no back pain or tenderness to suggest herniated disc. She has no associated symptoms or signs to suggest a central cause. At this point, I would favor getting her an AFO brace and next steps diagnostically would be an EMG which would have to be done as outpatient.   Recommendations: 1) EMG/NCS  2) AFO brace and consider  PT eval 3) stop crossing legs.  4) outpatient neurology follow up.    Ritta Slot, MD [facility] [phone]  If 7pm- 7am, please page neurology on call as listed in [facility].

## 2022-06-07 NOTE — Progress Notes (Signed)
Orthopedic Tech Progress Note Patient Details:  Rose Wilkins 03-20-1936 [id number]  [facility]  needed a little more information about patient.  Patient ID: Rose Wilkins, female   DOB: 08/23/1936, 86 y.o.   MRN: [id number]  Donald Pore 06/07/2022, 9:49 AM

## 2022-06-07 NOTE — ED Provider Notes (Signed)
[facility] Provider Note    Event Date/Time   First MD Initiated Contact with Patient 06/07/22 0719     (approximate)   History   Ankle Injury (Patient presents with RIGHT ankle swelling and pain after a fall; States that she got up to walk and "couldn't move" her RIGHT foot and fell; Denies LOC, dizziness; History of neuropathy, says she has a cane/walker that she is suppose to use but does not)   HPI  Rose Wilkins is a 86 y.o. female with CKD, depression, B12 deficiency, hyperlipidemia who comes in with concern for right ankle difficulty moving.  Patient reports that she went to bed last night at 11:30 PM on 06/06/2022.  She reports that she woke up in the morning and she had inability to dorsiflex her right ankle.  She denies ever having this problem previously.  She reports that she tried to take some few steps and she noticed that it was not working and she almost fell but denies actually falling down onto the ground.  She is supposed to use a cane and walker to ambulate at home but does not typically use it.  She denies ever having this happen previously.  She denies any slurred speech, weakness anywhere else, vision changes or other issues.   Physical Exam   Triage Vital Signs: ED Triage Vitals  Enc Vitals Group     BP 06/07/22 0713 (!) 190/80     Pulse Rate 06/07/22 0713 61     Resp 06/07/22 0713 19     Temp 06/07/22 0713 98.3 F (36.8 C)     Temp Source 06/07/22 0713 Oral     SpO2 06/07/22 0713 98 %     Weight 06/07/22 0713 160 lb (72.6 kg)     Height 06/07/22 0713 5\' 6"  (1.676 m)     Head Circumference --      Peak Flow --      Pain Score 06/07/22 0714 0     Pain Loc --      Pain Edu? --      Excl. in GC? --     Most recent vital signs: Vitals:   06/07/22 0713  BP: (!) 190/80  Pulse: 61  Resp: 19  Temp: 98.3 F (36.8 C)  SpO2: 98%     General: Awake, no distress.  CV:  Good peripheral perfusion.  Resp:  Normal effort.   Abd:  No distention.  Other:  Cranial nerves II through XII are intact although patient does have right foot drop noted with good distal pulse.  No obvious swelling noted   ED Results / Procedures / Treatments   Labs (all labs ordered are listed, but only abnormal results are displayed) Labs Reviewed  CBC WITH DIFFERENTIAL/PLATELET  COMPREHENSIVE METABOLIC PANEL  MAGNESIUM  CK  VITAMIN B12       RADIOLOGY I have reviewed the xray personally and interpreted and there is no evidence of any fracture  PROCEDURES:  Critical Care performed: No  Procedures   MEDICATIONS ORDERED IN ED: Medications - No data to display   IMPRESSION / MDM / ASSESSMENT AND PLAN / ED COURSE  I reviewed the triage vital signs and the nursing notes.   Patient's presentation is most consistent with acute presentation with potential threat to life or bodily function.   Differential includes stroke, electrolyte abnormality, B12 deficiency, severe neuropathy.  We will proceed with CT imaging to evaluate for head or spinal injuries and basic  labs then discuss further with neurology  CT imaging, x-ray were reassuring CBC with slightly low hemoglobin but normal white count CMP shows slightly elevated creatinine but last check was 2 years ago was also slightly elevated CK normal Bladder scan re-assuring. No new urinary symptoms  Consulted Dr. Amada Jupiter from neurology given sudden onset of foot drop to decide if we needed any additional MRIs.  Patient has isolated foot drop and has a history of peripheral neuropathies.  She is got no back pain or urinary issues to suggest cord compression.  He recommends AFO brace and we can see if PT can see her to see how she does walking to see if she is safe for discharge but will need follow-up outpatient with neurology for EMG  Discussed with patient that if she is having difficulty ambulating that we can always consider admission for SNF placement but patient is  very adamant that she would like to try to go home after PT evaluation  The patient is on the cardiac monitor to evaluate for evidence of arrhythmia and/or significant heart rate changes.      FINAL CLINICAL IMPRESSION(S) / ED DIAGNOSES   Final diagnoses:  Right foot drop  Neuropathy     Rx / DC Orders   ED Discharge Orders     None        Note:  This document was prepared using Dragon voice recognition software and may include unintentional dictation errors.   Concha Se, MD 06/07/22 1040

## 2022-10-31 NOTE — Progress Notes (Signed)
 No chief complaint on file.   HPI  Rose Wilkins is a 86 y.o. here for an acute issue.  She has a PMH of HTN, HLD, neuropathy anemia, GERD, DDD cervical, who reports falling a few days ago landing on her right side.  Suffers pain to the right mid back and pain is worse with movement of the right arm.  No bruising.  No shortness of breath.    ROS  Pertinent items are noted in HPI.  Outpatient Encounter Medications as of 10/31/2022  Medication Sig Dispense Refill  . acetaminophen (TYLENOL) 500 MG tablet Take 1,000 mg by mouth 2 (two) times daily    . DULoxetine (CYMBALTA) 20 MG DR capsule Take 1 capsule (20 mg total) by mouth once daily 90 capsule 3  . ibuprofen (MOTRIN) 600 MG tablet Take 1 tablet (600 mg total) by mouth every 8 (eight) hours as needed for Pain 60 tablet 1  . lansoprazole (PREVACID) 30 MG DR capsule Take 30 mg by mouth once daily.    [name] MAGNESIUM ORAL Take 1 tablet by mouth once daily.    . pregabalin (LYRICA) 25 MG capsule TAKE 1 CAPSULE BY MOUTH TWO TIMES DAILY 60 capsule 5  . telmisartan (MICARDIS) 40 MG tablet Take 40 mg by mouth once daily    . triamcinolone 0.1 % cream APPLY TO AFFECTED AREA TWICE A DAY 45 g 3   No facility-administered encounter medications on file as of 10/31/2022.    Allergies as of 10/31/2022 - Reviewed 09/21/2022  Allergen Reaction Noted  . Gabapentin Rash 05/31/2021  . Zoloft [sertraline] Diarrhea 02/21/2020  . Sulfa (sulfonamide antibiotics) Rash 08/06/2014    Past Medical History:  Diagnosis Date  . Anemia, unspecified   . DDD (degenerative disc disease), cervical   . Essential hypertension, benign   . Exostosis of unspecified site   . Iron deficiency anemia, unspecified   . Other and unspecified hyperlipidemia   . Postmenopausal state   . PVC's (premature ventricular contractions)   . Reflux esophagitis     Past Surgical History:  Procedure Laterality Date  . ENDOSCOPIC CARPAL TUNNEL RELEASE Right 09/09/2020   Dr. Kathlynn     Vitals:   10/31/22 1413  BP: (!) 140/78  Pulse: 82    Physical Exam  General. Well appearing; NAD; VS reviewed     HEENT: Sclera and conjunctiva clear; EOMI,  Neck. Supple. Lungs. Respirations unlabored; clear to auscultation bilaterally. Cardiovascular. Heart regular rate and rhythm without murmurs, gallops, or rubs. Right shoulder: See below. Skin. Normal color and turgor Neurologic. Alert and oriented x3   Assessment and Plan 1. Mid back pain on right side She does have pain aggravated by shoulder movement and has positive supraspinatus test.  X-ray first of the ribs.  She will continue ibuprofen, Tylenol for now. -     X-ray ribs left with PA chest 3 plus views; Future -     ibuprofen (MOTRIN) 600 MG tablet; Take 1 tablet (600 mg total) by mouth every 8 (eight) hours as needed for Pain  2.  History of closed displaced fracture of proximal end of right humerus,    3. Acute pain of right shoulder Possible rotator cuff injury as well.  If not improving can consult orthopedics.  4. Essential hypertension, benign Has been taking Micardis since the injury for elevated blood pressure.    I have personally performed this service.  [address] [place], [place]

## 2022-12-22 NOTE — Procedures (Signed)
 After time out occurred and verbal informed consent obtained, 1cc Marcaine and 1.5cc Kenalog were injected under sterile conditions as a trigger point injection into the right pyriformis area without complication.

## 2022-12-22 NOTE — Progress Notes (Signed)
 Patient Profile:   Rose Wilkins  is a 87 y.o.  female Chief Complaint  Patient presents with  . Hip Pain    Right hip pain since December third. Noticed after doing an exercise that PT recommended. States she heard  a sound when she did this.  Did have a fall also in November.  . Bleeding/Bruising    Has noticed on her back. Also has  a bump  on back.      PROBLEM LIST: Past Medical History:  Diagnosis Date  . Anemia, unspecified   . DDD (degenerative disc disease), cervical   . Essential hypertension, benign   . Exostosis of unspecified site   . Iron deficiency anemia, unspecified   . Other and unspecified hyperlipidemia   . Postmenopausal state   . PVC's (premature ventricular contractions)   . Reflux esophagitis     Past Surgical History:  Procedure Laterality Date  . ENDOSCOPIC CARPAL TUNNEL RELEASE Right 09/09/2020   Dr. Kathlynn    ALLERGIES: Allergies  Allergen Reactions  . Gabapentin Rash  . Zoloft [Sertraline] Diarrhea  . Sulfa (Sulfonamide Antibiotics) Rash    Onset 07/31/1999.    CURRENT MEDICATIONS: Current Outpatient Medications: acetaminophen (TYLENOL) 500 MG tablet, Take 1,000 mg by mouth 2 (two) times daily, Taking cefUROXime (CEFTIN) 250 MG tablet, Take 1 tablet (250 mg total) by mouth 2 (two) times daily for 7 days, Taking DULoxetine (CYMBALTA) 20 MG DR capsule, Take 1 capsule (20 mg total) by mouth once daily, Taking ibuprofen (MOTRIN) 600 MG tablet, Take 1 tablet (600 mg total) by mouth every 8 (eight) hours as needed for Pain, PRN Not Currently Taking lansoprazole (PREVACID) 30 MG DR capsule, Take 30 mg by mouth once daily., Taking MAGNESIUM ORAL, Take 1 tablet by mouth once daily., Taking pregabalin (LYRICA) 25 MG capsule, TAKE 1 CAPSULE BY MOUTH TWO TIMES DAILY, Taking telmisartan (MICARDIS) 40 MG tablet, Take 40 mg by mouth once daily, Taking triamcinolone 0.1 % cream, APPLY TO AFFECTED AREA TWICE A  DAY, Taking    HPI   CLINICAL SUMMARY:  Patient fell out of her deck in November, landing on her right side, fracturing a rib.  She has a lump there which is slowly going down and has some ecchymoses tracking down into her buttock area.  She was doing some physical therapy and felt a pop and has had pain wrapping around into her right anterior thigh.  Worse with rotation and movement, minimal issues walking.  ROS: Review of systems is unremarkable for any active cardiac, respiratory, GI, GU, hematologic, neurologic, dermatologic, HEENT, or psychiatric symptoms except as noted above, 10 systems reviewed.  No fevers, chills, or constitutional symptoms.   PHYSICAL EXAM  Vital signs:  BP (!) 140/78 (BP Location: Left upper arm, Patient Position: Sitting, BP Cuff Size: Adult)   Pulse 95   Wt 76.5 kg (168 lb 9.6 oz)   LMP  (LMP Unknown)   SpO2 96%   BMI 27.21 kg/m  Body mass index is 27.21 kg/m.   Wt Readings from Last 3 Encounters:  12/22/22 76.5 kg (168 lb 9.6 oz)  10/31/22 77.1 kg (170 lb)  09/21/22 75.4 kg (166 lb 3.2 oz)     BP Readings from Last 3 Encounters:  12/22/22 (!) 140/78  10/31/22 (!) 140/78  09/21/22 130/70    Constitutional:NAD Neck: supple,  no thyromegaly, good ROM Respiratory:clear to auscultation, no rales or wheezes Cardiovascular:RRR, no murmur or gallop Abdominal:soft, good BS, NT Ext: no edema, good peripheral pulses, right chest wall hematoma, right focal piriformis tenderness Neuro: alert and oriented X 3, grossly nonfocal     ASSESSMENT/PLAN   Right chest wall hematoma-tracking distally, slowly getting better Right lumbar radiculitis-more of a L2, L3 area, long track into the right anterior thigh, right performers trigger point shot given, continue ibuprofen as needed  No sign for hip issue, not trochanteric Depression overall better Overall doing better  Dispo:   No follow-ups on file.

## 2023-10-18 NOTE — Progress Notes (Signed)
 ANNUAL EXAM  Rose Wilkins is a 87 y.o. female  CHIEF COMPLAINT: Chief Complaint  Patient presents with  . Annual Exam    Two recent falls. The second time she fell, she struck her face. Did not have evaluated. Remains tender. Unsure if she has visual changes.   Questioning if PT in home would be beneficial.    SUBJECTIVE:  Patient is fallen a couple times, working on the yard.  Gets discouraged that it takes her so long to get ready that she is not as social.  Feels very fatigued. ______________________________________________________________________ A comprehensive ROS was negative in all 10 systems reviewed.  ALLERGIES: Gabapentin, Zoloft [sertraline], and Sulfa (sulfonamide antibiotics)  Past Medical History:  Diagnosis Date  . Anemia, unspecified   . DDD (degenerative disc disease), cervical   . Essential hypertension, benign   . Exostosis of unspecified site   . Iron deficiency anemia, unspecified   . Other and unspecified hyperlipidemia   . Postmenopausal state   . PVC's (premature ventricular contractions)   . Reflux esophagitis     Past Surgical History:  Procedure Laterality Date  . ENDOSCOPIC CARPAL TUNNEL RELEASE Right 09/09/2020   Dr. Kathlynn    Current Outpatient Medications  Medication Sig Dispense Refill  . acetaminophen (TYLENOL) 500 MG tablet Take 500 mg by mouth 2 (two) times daily    . CRANBERRY ORAL Take by mouth One daily    . MAGNESIUM ORAL Take 1 tablet by mouth once daily NOT EVERY DAY.    . naproxen sodium (ALEVE) 220 MG tablet Take 220 mg by mouth 2 (two) times daily with meals    . omeprazole magnesium (PRILOSEC OTC ORAL) Take by mouth One daily    . pregabalin (LYRICA) 25 MG capsule take 1 capsule by mouth two times daily 60 capsule 5  . DULoxetine (CYMBALTA) 30 MG DR capsule Take 1 capsule (30 mg total) by mouth once daily 90 capsule 3  . triamcinolone 0.1 % cream APPLY TO AFFECTED AREA TWICE A DAY (Patient not taking: Reported on  10/18/2023) 45 g 3   Current Facility-Administered Medications  Medication Dose Route Frequency Provider Last Rate Last Admin  . cyanocobalamin (VITAMIN B12) injection 1,000 mcg  1,000 mcg Intramuscular Once Cleotilde Oneil Novel, MD        PHYSICAL EXAM: BP (!) 140/80   Pulse 102   Ht 167.6 cm (5' 6)   Wt 75.4 kg (166 lb 3.2 oz)   LMP  (LMP Unknown)   SpO2 95%   BMI 26.83 kg/m  Body mass index is 26.83 kg/m.  Wt Readings from Last 3 Encounters:  10/18/23 75.4 kg (166 lb 3.2 oz)  08/08/23 76.8 kg (169 lb 6.4 oz)  04/06/23 75.8 kg (167 lb)     BP Readings from Last 3 Encounters:  10/18/23 (!) 140/80  08/08/23 (!) 140/80  12/22/22 (!) 140/78    General: Alert oriented x3  Skin: No suspicious lesions or moles.   Eyes: Sclera and conjunctiva clear; pupils equal round and reactive to light and accommodation; extraocular movements intact Ears: External ears and canal normal; tympanic membranes normal.   Nose: Mucosa healthy without drainage or ulceration Oropharynx: No suspicious lesions Neck: No swelling, masses, stiffness, pain, limited movement, carotid pulses normal bilaterally, thyroid normal size, no masses palpated. No bruits heard. Lungs: Respirations unlabored; clear to auscultation bilaterally Back: No spinal deformity Cardiovascular: Heart regular rate and rhythm without murmurs, gallops, or rubs Abdomen: Soft; non tender; non distended;  no masses  or organomegaly Lymph Nodes: No significant cervical, supraclavicular, or axillary lymphadenopathy noted Musculoskeletal: No active joint inflammation Extremities: Normal, no edema Pulses: Dorsalis pedis palpable and symmetric bilaterally Neurologic: Alert and oriented times 3; speech intact; face symmetrical; gait instability  ASSESSMENT/PLAN  Polyneuropathy-very limiting to her, Lyrica has been helpful, Cymbalta some as well, increase in Balter to 30 mg daily from 20, hopefully that will give her some energy, B12  shot given which could help her neuropathy as well  Overall gets out 2-3 times per week socially CKD 3-creatinine stable 1.4 Hyperglycemia-A1c stable 6.0 History of UTIs-no recurrent UTIs 6-month reevaluation-things are overall stable, family very helpful, particular her daughter   Goals Addressed               This Visit's Progress   .  COMPLETED: Maintain health/healthy lifestyle      .  COMPLETED: Maintain health/healthy lifestyle      . * Patient Goals (pt-stated)        To walk better.        .phq2  Return in about 6 months (around 04/17/2024) for followup.             *Some images could not be shown.

## 2023-12-10 IMAGING — CT CT HEAD W/O CM
4 series · 16 of 47 positions shown, 18 images · non-contrast
Comparison: None Available.

CLINICAL DATA: Neuro deficit. Stroke suspected. Right foot drop.
Status post fall. Right ankle pain and.



[Series 2: head wo · axial · 0.39mm/px · z∈[-107,+23]mm · 7 of 36 slices shown, 9 images]
[im 5/36  brain]
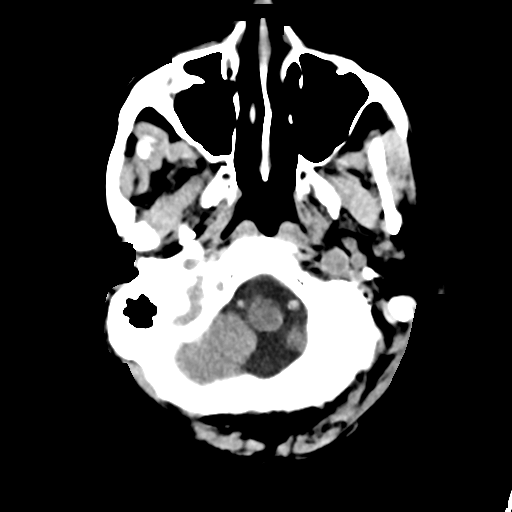
[im 5/36  bone]
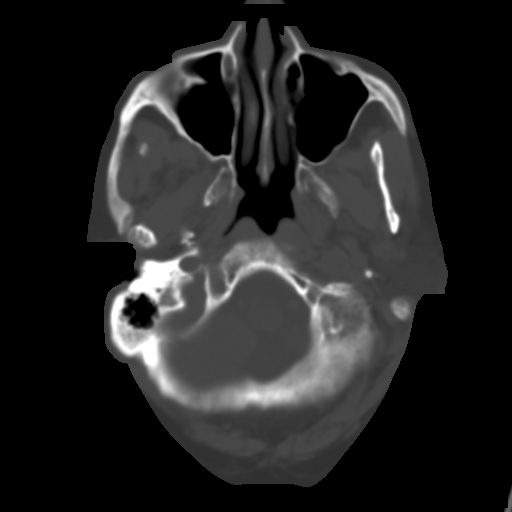
[im 9/36  brain]
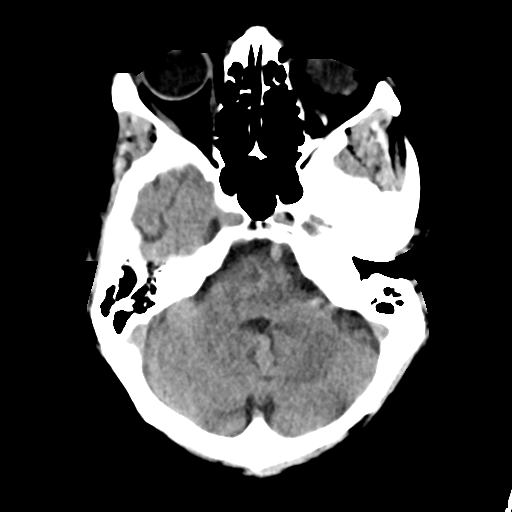
[im 14/36  brain]
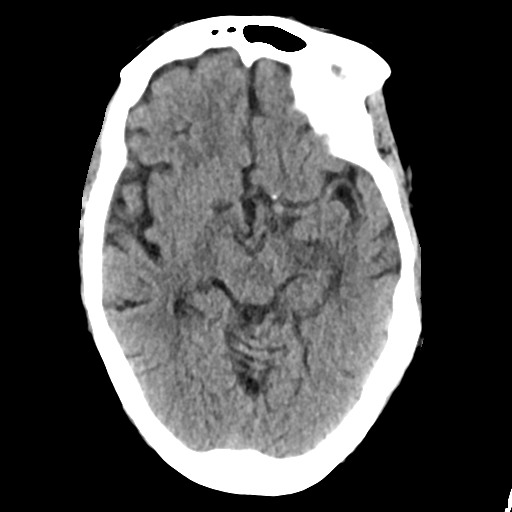
[im 18/36  brain]
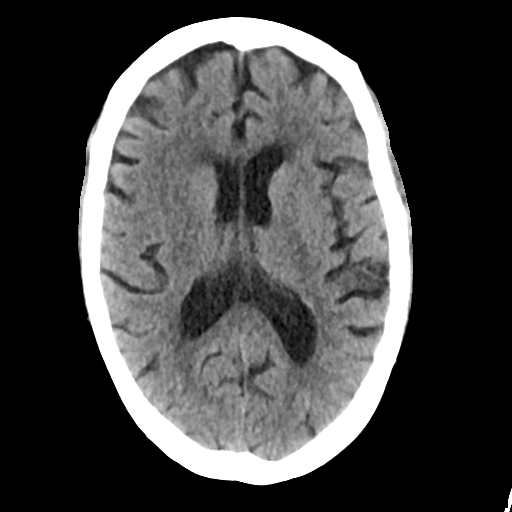
[im 22/36  brain]
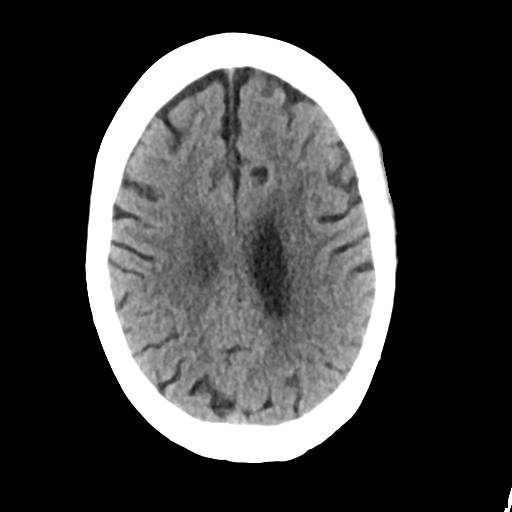
[im 22/36  bone]
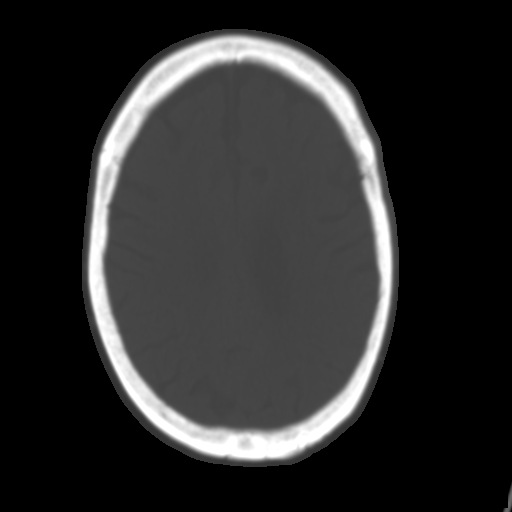
[im 27/36  brain]
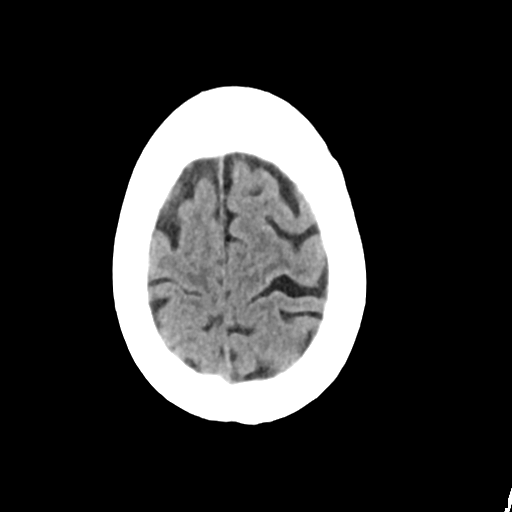
[im 31/36  brain]
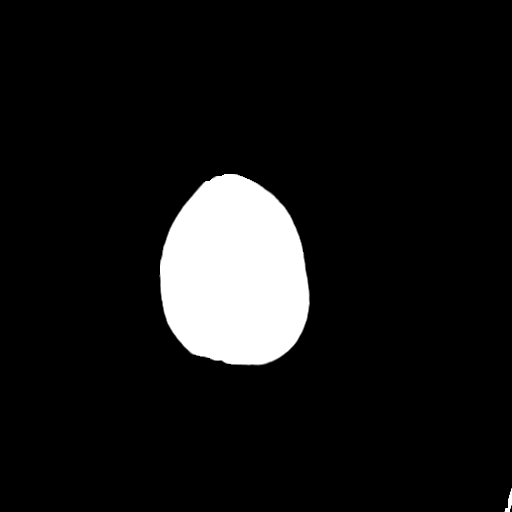

[Series 3: head bone · axial · 0.39mm/px · z∈[-111,-75]mm · 3 of 90 slices shown]
[im 9/90  bone]
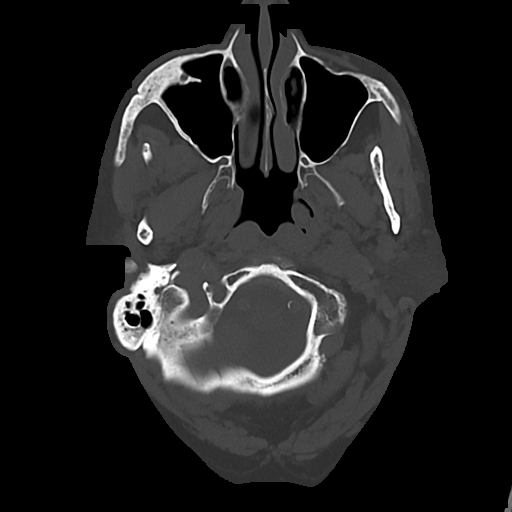
[im 18/90  bone]
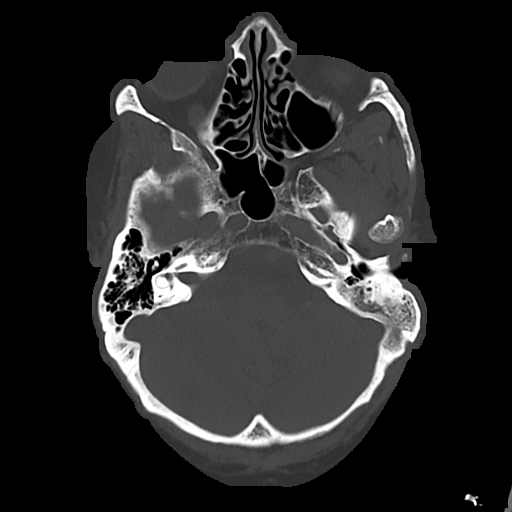
[im 27/90  bone]
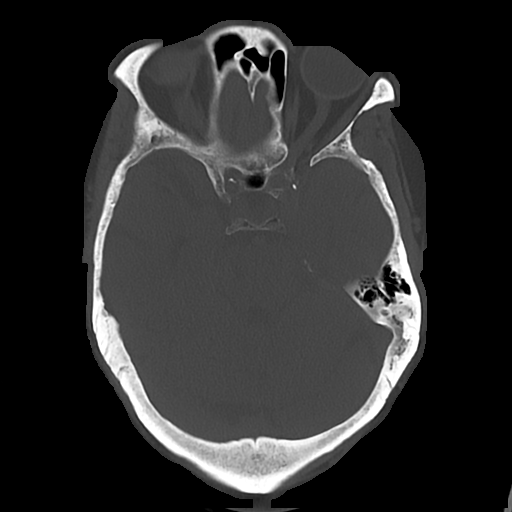

[Series 4: cor soft · coronal · 0.31mm/px · 3 of 71 slices shown]
[im 24/71  brain]
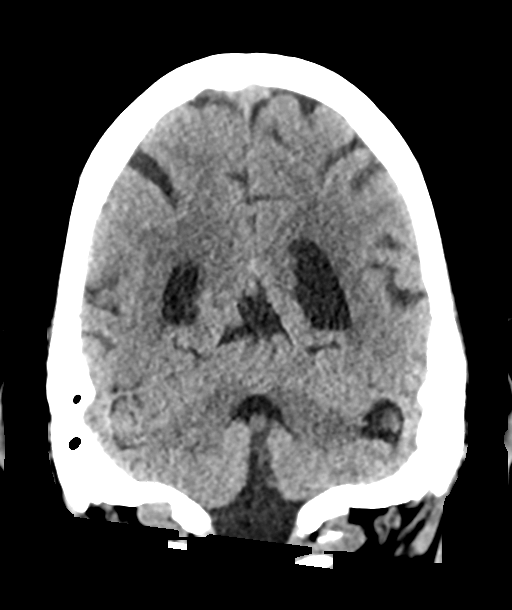
[im 32/71  brain]
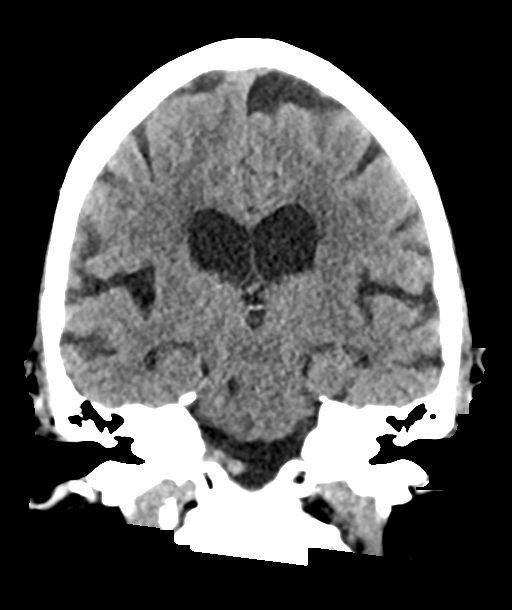
[im 39/71  brain]
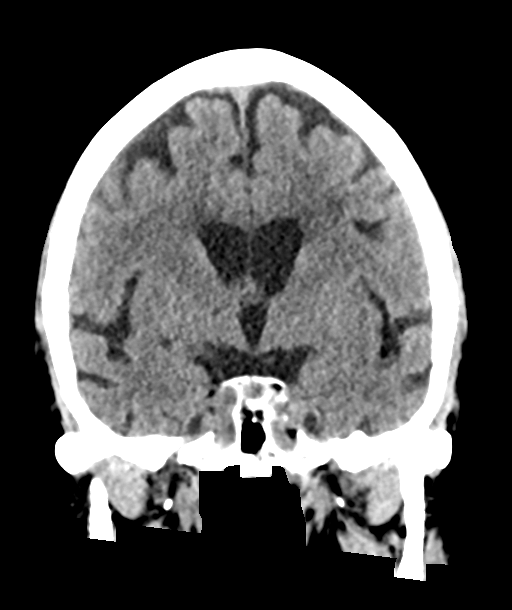

[Series 5: sag soft · sagittal · 0.35mm/px · 3 of 55 slices shown]
[im 19/55  brain]
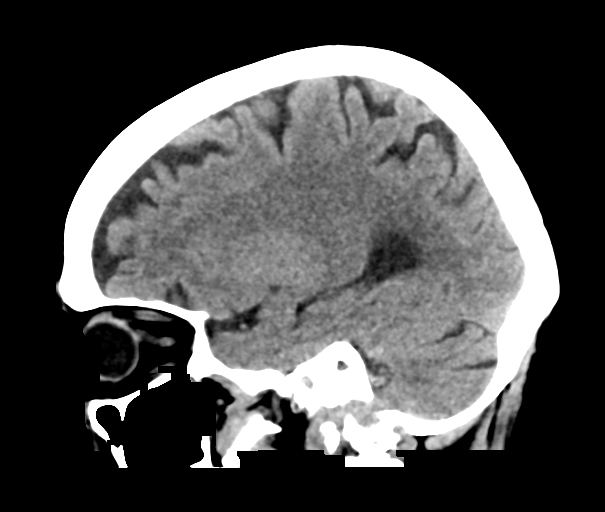
[im 28/55  brain]
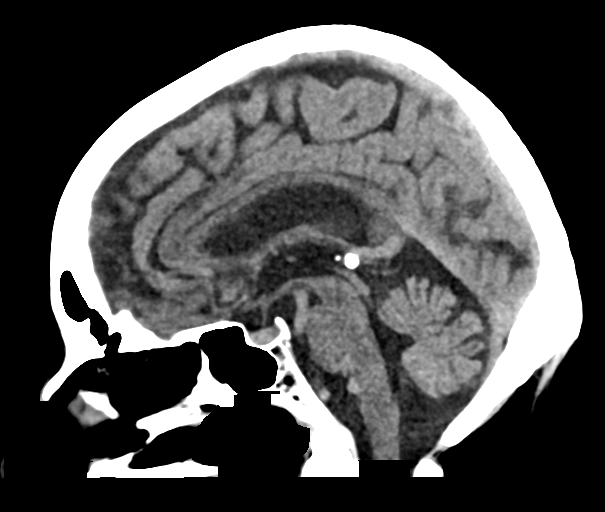
[im 36/55  brain]
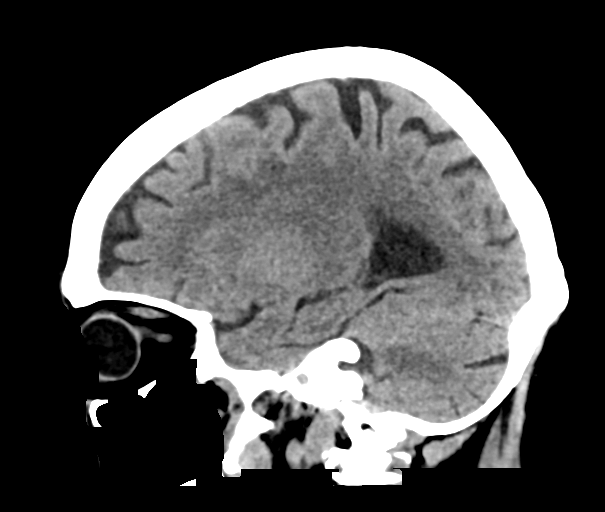

[16 of 47 positions shown; findings below may reference images not displayed]

FINDINGS: CT HEAD FINDINGS

Brain: No evidence of acute infarction, hemorrhage, hydrocephalus,
extra-axial collection or mass lesion/mass effect. There is mild
low-attenuation within the subcortical and periventricular white
matter compatible with chronic microvascular disease. Prominence
sulci mild brain

Vascular: No hyperdense vessel or unexpected calcification.

Skull: Normal. Negative for fracture or focal lesion.

Sinuses/Orbits: No acute finding.

Other: None.

CT CERVICAL SPINE FINDINGS

Alignment: Normal.

Skull base and vertebrae: No acute fracture. No primary bone lesion
or focal pathologic process.

Soft tissues and spinal canal: No prevertebral fluid or swelling. No
visible canal hematoma.

Disc levels: Moderate to marked disc space narrowing and endplate
spurring identified at C5-6, C6-7 C7-T1. Moderate degenerative
changes are also noted at the atlanto-axial joint.

Upper chest: Negative.

Other: None
IMPRESSION: 1. No evidence for acute intracranial abnormality.
2. Chronic microvascular disease and brain atrophy.
3. No evidence for cervical spine fracture or subluxation.
4. Cervical degenerative disc disease.

## 2023-12-10 IMAGING — DX DG ANKLE COMPLETE 3+V*R*
3 series · 3 of 3 positions shown · non-contrast
Comparison: None Available.

CLINICAL DATA: Fall

EXAM:
RIGHT ANKLE - COMPLETE 3 VIEW

[ankle ap]
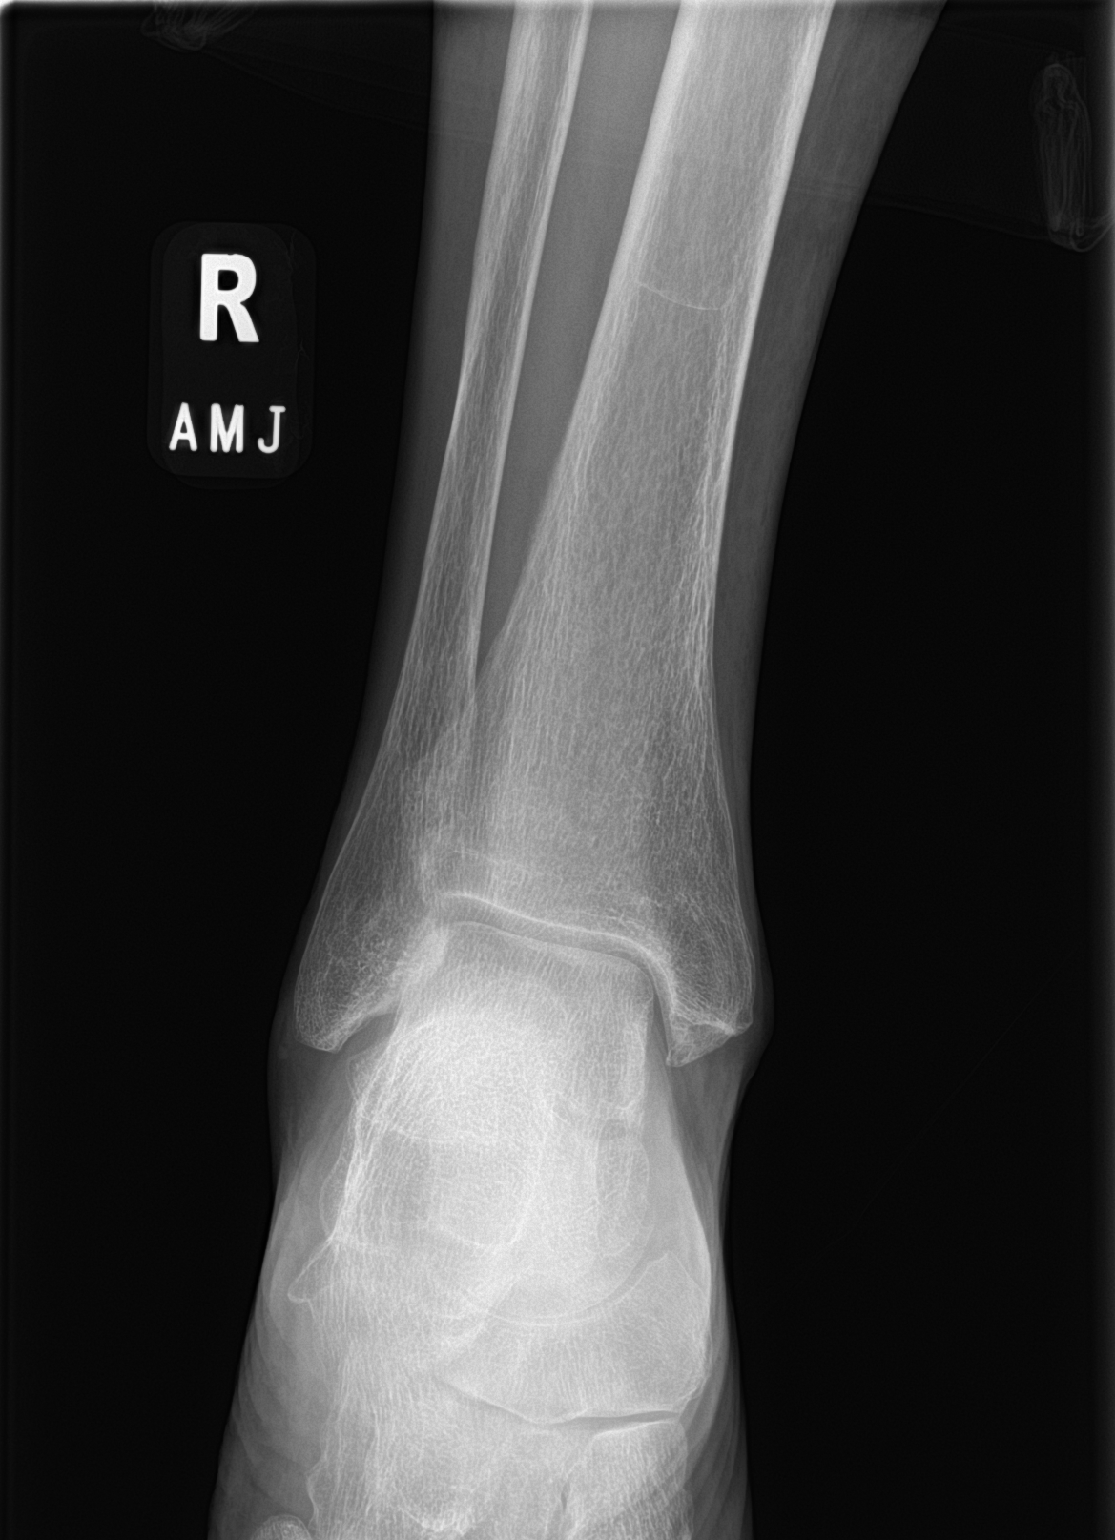

[ankle obl]
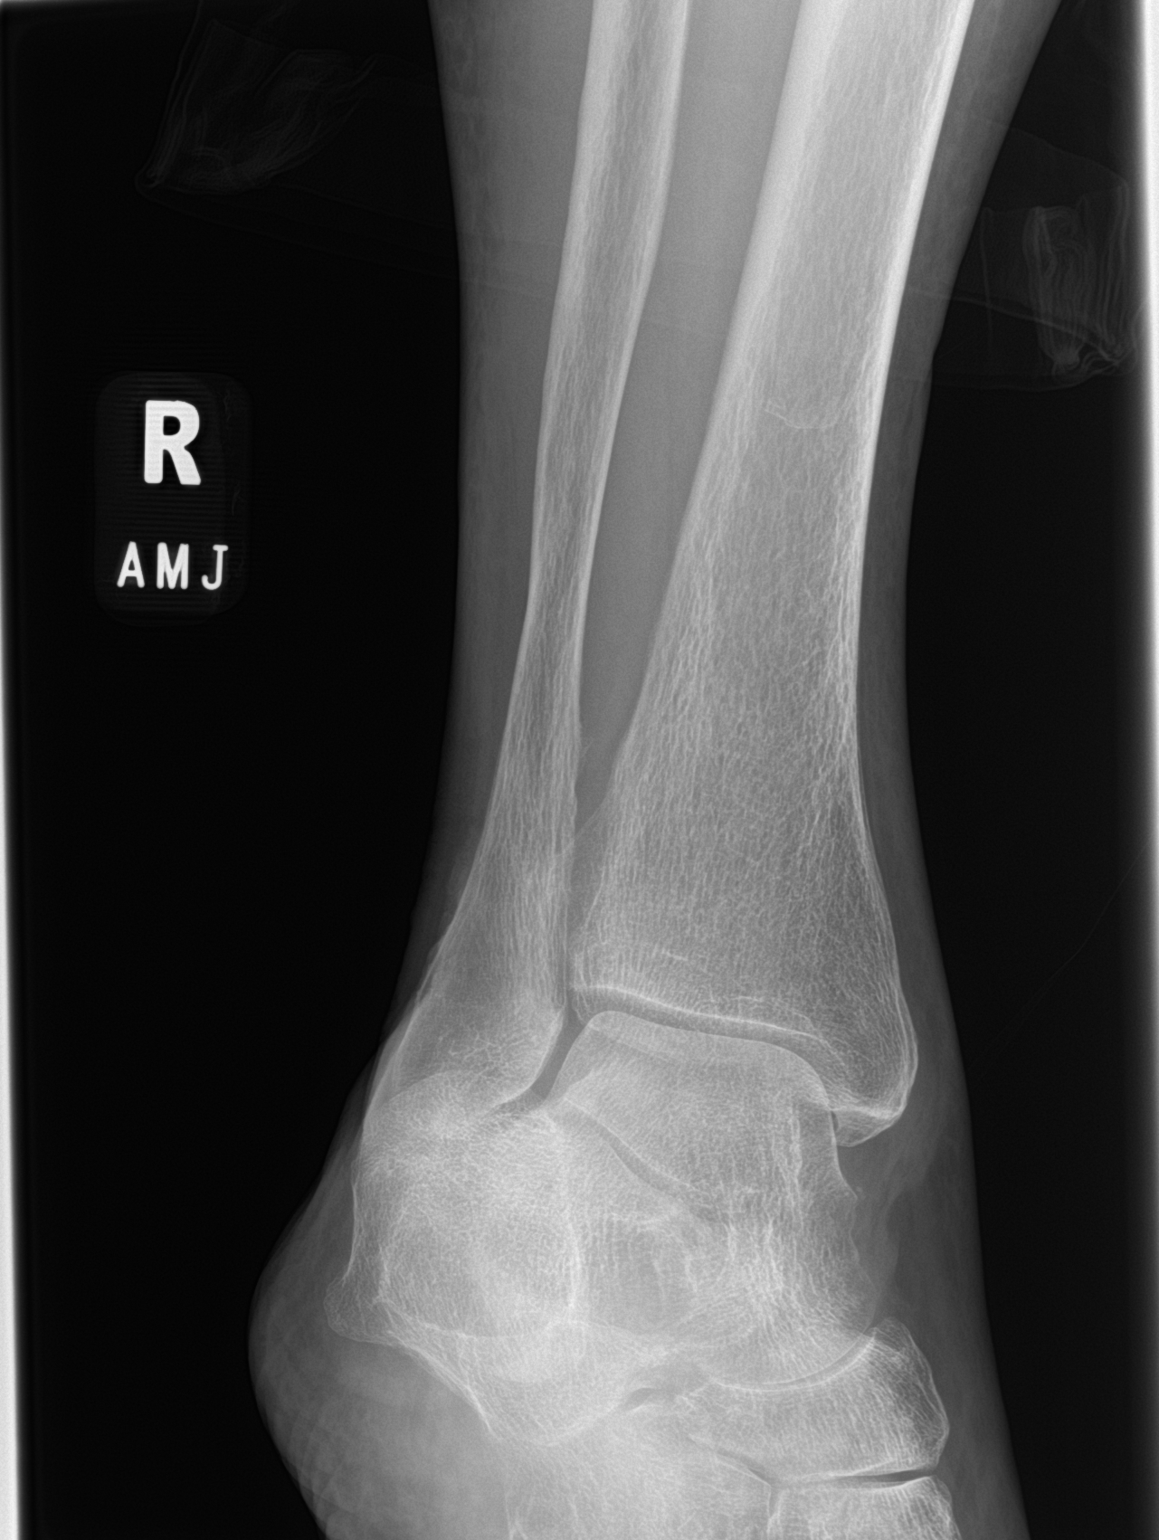

[ankle lat]
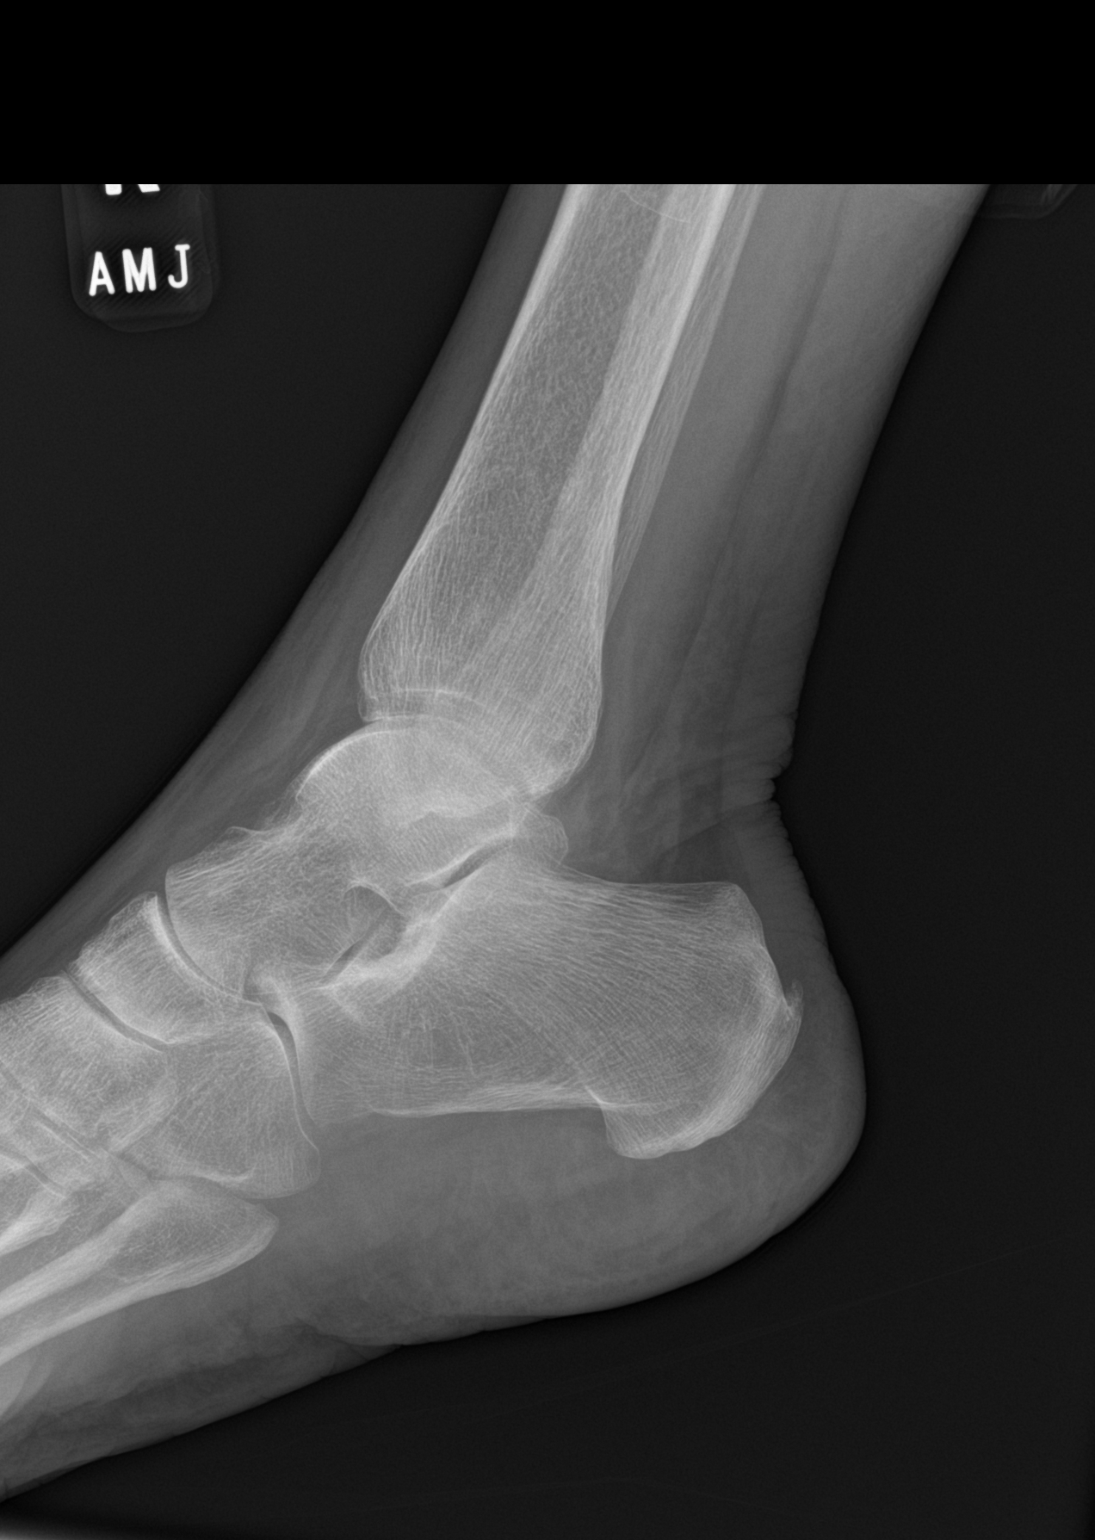

[3 of 3 positions shown; findings below may reference images not displayed]

FINDINGS: There is no evidence of fracture, dislocation, or joint effusion.
Diffuse demineralization. No significant degenerative changes. Soft
tissues are unremarkable.
IMPRESSION: No acute osseous abnormality.

## 2024-10-17 ENCOUNTER — Other Ambulatory Visit: Payer: Self-pay

## 2024-10-17 ENCOUNTER — Emergency Department

## 2024-10-17 ENCOUNTER — Encounter
Admission: EM | Disposition: A | Discharge status: Home Health | Payer: Self-pay | Source: Home / Self Care | Attending: Osteopathic Medicine

## 2024-10-17 ENCOUNTER — Inpatient Hospital Stay
Admission: EM | Admit: 2024-10-17 | Discharge: 2024-10-20 | DRG: 322 | Disposition: A | Discharge status: Home Health | Attending: Osteopathic Medicine | Admitting: Osteopathic Medicine

## 2024-10-17 DIAGNOSIS — M4854XA Collapsed vertebra, not elsewhere classified, thoracic region, initial encounter for fracture: Secondary | ICD-10-CM | POA: Diagnosis present

## 2024-10-17 DIAGNOSIS — Z882 Allergy status to sulfonamides status: Secondary | ICD-10-CM

## 2024-10-17 DIAGNOSIS — D631 Anemia in chronic kidney disease: Secondary | ICD-10-CM | POA: Diagnosis present

## 2024-10-17 DIAGNOSIS — I2109 ST elevation (STEMI) myocardial infarction involving other coronary artery of anterior wall: Secondary | ICD-10-CM | POA: Diagnosis present

## 2024-10-17 DIAGNOSIS — I10 Essential (primary) hypertension: Secondary | ICD-10-CM | POA: Diagnosis not present

## 2024-10-17 DIAGNOSIS — K219 Gastro-esophageal reflux disease without esophagitis: Secondary | ICD-10-CM | POA: Diagnosis present

## 2024-10-17 DIAGNOSIS — I2102 ST elevation (STEMI) myocardial infarction involving left anterior descending coronary artery: Secondary | ICD-10-CM | POA: Diagnosis not present

## 2024-10-17 DIAGNOSIS — F32A Depression, unspecified: Secondary | ICD-10-CM | POA: Diagnosis present

## 2024-10-17 DIAGNOSIS — E663 Overweight: Secondary | ICD-10-CM | POA: Diagnosis present

## 2024-10-17 DIAGNOSIS — Z7982 Long term (current) use of aspirin: Secondary | ICD-10-CM | POA: Diagnosis not present

## 2024-10-17 DIAGNOSIS — I4510 Unspecified right bundle-branch block: Secondary | ICD-10-CM | POA: Diagnosis not present

## 2024-10-17 DIAGNOSIS — W19XXXA Unspecified fall, initial encounter: Secondary | ICD-10-CM | POA: Diagnosis present

## 2024-10-17 DIAGNOSIS — I2510 Atherosclerotic heart disease of native coronary artery without angina pectoris: Secondary | ICD-10-CM | POA: Diagnosis present

## 2024-10-17 DIAGNOSIS — G629 Polyneuropathy, unspecified: Secondary | ICD-10-CM | POA: Diagnosis present

## 2024-10-17 DIAGNOSIS — E871 Hypo-osmolality and hyponatremia: Secondary | ICD-10-CM | POA: Diagnosis present

## 2024-10-17 DIAGNOSIS — Z7902 Long term (current) use of antithrombotics/antiplatelets: Secondary | ICD-10-CM

## 2024-10-17 DIAGNOSIS — Z888 Allergy status to other drugs, medicaments and biological substances status: Secondary | ICD-10-CM

## 2024-10-17 DIAGNOSIS — I25110 Atherosclerotic heart disease of native coronary artery with unstable angina pectoris: Secondary | ICD-10-CM | POA: Clinically undetermined

## 2024-10-17 DIAGNOSIS — I340 Nonrheumatic mitral (valve) insufficiency: Secondary | ICD-10-CM | POA: Diagnosis present

## 2024-10-17 DIAGNOSIS — N179 Acute kidney failure, unspecified: Secondary | ICD-10-CM | POA: Diagnosis present

## 2024-10-17 DIAGNOSIS — E785 Hyperlipidemia, unspecified: Secondary | ICD-10-CM | POA: Diagnosis present

## 2024-10-17 DIAGNOSIS — R079 Chest pain, unspecified: Secondary | ICD-10-CM | POA: Diagnosis not present

## 2024-10-17 DIAGNOSIS — N1831 Chronic kidney disease, stage 3a: Secondary | ICD-10-CM | POA: Diagnosis present

## 2024-10-17 DIAGNOSIS — Z955 Presence of coronary angioplasty implant and graft: Secondary | ICD-10-CM

## 2024-10-17 DIAGNOSIS — Z6824 Body mass index (BMI) 24.0-24.9, adult: Secondary | ICD-10-CM

## 2024-10-17 DIAGNOSIS — I129 Hypertensive chronic kidney disease with stage 1 through stage 4 chronic kidney disease, or unspecified chronic kidney disease: Secondary | ICD-10-CM | POA: Diagnosis present

## 2024-10-17 DIAGNOSIS — Z79899 Other long term (current) drug therapy: Secondary | ICD-10-CM | POA: Diagnosis not present

## 2024-10-17 DIAGNOSIS — I213 ST elevation (STEMI) myocardial infarction of unspecified site: Secondary | ICD-10-CM | POA: Diagnosis present

## 2024-10-17 HISTORY — PX: CORONARY/GRAFT ACUTE MI REVASCULARIZATION: CATH118305

## 2024-10-17 HISTORY — PX: LEFT HEART CATH AND CORONARY ANGIOGRAPHY: CATH118249

## 2024-10-17 LAB — TROPONIN I (HIGH SENSITIVITY)
Troponin I (High Sensitivity): 53 ng/L — ABNORMAL HIGH (ref <18)
Troponin I (High Sensitivity): 4864 ng/L (ref <18)
Troponin I (High Sensitivity): 12477 ng/L (ref <18)

## 2024-10-17 LAB — POCT ACTIVATED CLOTTING TIME
Activated Clotting Time: 193 s
Activated Clotting Time: 239 s
Activated Clotting Time: 268 s
Activated Clotting Time: 297 s

## 2024-10-17 LAB — CBC WITH DIFFERENTIAL/PLATELET
Abs Immature Granulocytes: 0.05 K/uL (ref 0.00–0.07)
Basophils Absolute: 0 K/uL (ref 0.0–0.1)
Basophils Relative: 0 %
Eosinophils Absolute: 0 K/uL (ref 0.0–0.5)
Eosinophils Relative: 0 %
HCT: 37.8 % (ref 36.0–46.0)
Hemoglobin: 12.4 g/dL (ref 12.0–15.0)
Immature Granulocytes: 1 %
Lymphocytes Relative: 13 %
Lymphs Abs: 1.3 K/uL (ref 0.7–4.0)
MCH: 32 pg (ref 26.0–34.0)
MCHC: 32.8 g/dL (ref 30.0–36.0)
MCV: 97.7 fL (ref 80.0–100.0)
Monocytes Absolute: 0.6 K/uL (ref 0.1–1.0)
Monocytes Relative: 5 %
Neutro Abs: 8.4 K/uL — ABNORMAL HIGH (ref 1.7–7.7)
Neutrophils Relative %: 81 %
Platelets: 229 K/uL (ref 150–400)
RBC: 3.87 MIL/uL (ref 3.87–5.11)
RDW: 13.2 % (ref 11.5–15.5)
WBC: 10.4 K/uL (ref 4.0–10.5)
nRBC: 0 % (ref 0.0–0.2)

## 2024-10-17 LAB — COMPREHENSIVE METABOLIC PANEL WITH GFR
ALT: 16 U/L (ref 0–44)
AST: 41 U/L (ref 15–41)
Albumin: 3.7 g/dL (ref 3.5–5.0)
Alkaline Phosphatase: 67 U/L (ref 38–126)
Anion gap: 10 (ref 5–15)
BUN: 29 mg/dL — ABNORMAL HIGH (ref 8–23)
CO2: 20 mmol/L — ABNORMAL LOW (ref 22–32)
Calcium: 8.8 mg/dL — ABNORMAL LOW (ref 8.9–10.3)
Chloride: 102 mmol/L (ref 98–111)
Creatinine, Ser: 1.2 mg/dL — ABNORMAL HIGH (ref 0.44–1.00)
GFR, Estimated: 44 mL/min — ABNORMAL LOW (ref >60)
Glucose, Bld: 129 mg/dL — ABNORMAL HIGH (ref 70–99)
Potassium: 4.8 mmol/L (ref 3.5–5.1)
Sodium: 132 mmol/L — ABNORMAL LOW (ref 135–145)
Total Bilirubin: 0.7 mg/dL (ref 0.0–1.2)
Total Protein: 7.5 g/dL (ref 6.5–8.1)

## 2024-10-17 LAB — BASIC METABOLIC PANEL WITH GFR
Anion gap: 10 (ref 5–15)
BUN: 31 mg/dL — ABNORMAL HIGH (ref 8–23)
CO2: 23 mmol/L (ref 22–32)
Calcium: 8.8 mg/dL — ABNORMAL LOW (ref 8.9–10.3)
Chloride: 101 mmol/L (ref 98–111)
Creatinine, Ser: 1.32 mg/dL — ABNORMAL HIGH (ref 0.44–1.00)
GFR, Estimated: 39 mL/min — ABNORMAL LOW (ref >60)
Glucose, Bld: 172 mg/dL — ABNORMAL HIGH (ref 70–99)
Potassium: 4.5 mmol/L (ref 3.5–5.1)
Sodium: 134 mmol/L — ABNORMAL LOW (ref 135–145)

## 2024-10-17 LAB — CBC
HCT: 37.1 % (ref 36.0–46.0)
Hemoglobin: 12.4 g/dL (ref 12.0–15.0)
MCH: 32.6 pg (ref 26.0–34.0)
MCHC: 33.4 g/dL (ref 30.0–36.0)
MCV: 97.6 fL (ref 80.0–100.0)
Platelets: 211 K/uL (ref 150–400)
RBC: 3.8 MIL/uL — ABNORMAL LOW (ref 3.87–5.11)
RDW: 13.2 % (ref 11.5–15.5)
WBC: 5.7 K/uL (ref 4.0–10.5)
nRBC: 0 % (ref 0.0–0.2)

## 2024-10-17 LAB — LIPID PANEL
Cholesterol: 231 mg/dL — ABNORMAL HIGH (ref 0–200)
HDL: 49 mg/dL (ref >40)
LDL Cholesterol: 171 mg/dL — ABNORMAL HIGH (ref 0–99)
Total CHOL/HDL Ratio: 4.7 ratio
Triglycerides: 56 mg/dL (ref <150)
VLDL: 11 mg/dL (ref 0–40)

## 2024-10-17 LAB — CG4 I-STAT (LACTIC ACID): Lactic Acid, Venous: 0.8 mmol/L (ref 0.5–1.9)

## 2024-10-17 LAB — MRSA NEXT GEN BY PCR, NASAL: MRSA by PCR Next Gen: NOT DETECTED

## 2024-10-17 LAB — GLUCOSE, CAPILLARY: Glucose-Capillary: 108 mg/dL — ABNORMAL HIGH (ref 70–99)

## 2024-10-17 LAB — PROTIME-INR
INR: 1 (ref 0.8–1.2)
Prothrombin Time: 13.2 s (ref 11.4–15.2)

## 2024-10-17 LAB — APTT: aPTT: 29 s (ref 24–36)

## 2024-10-17 LAB — OSMOLALITY: Osmolality: 292 mosm/kg (ref 275–295)

## 2024-10-17 SURGERY — CORONARY/GRAFT ACUTE MI REVASCULARIZATION
Anesthesia: Moderate Sedation

## 2024-10-17 MED ORDER — MIDAZOLAM HCL (PF) 2 MG/2ML IJ SOLN
INTRAMUSCULAR | Status: DC | PRN
  Administered 2024-10-17: 1 mg via INTRAVENOUS

## 2024-10-17 MED ORDER — TRAZODONE HCL 50 MG PO TABS: 75.0000 mg | ORAL_TABLET | Freq: Every day | ORAL | Status: DC

## 2024-10-17 MED ORDER — SODIUM CHLORIDE 0.9% FLUSH
3.0000 mL | Freq: Two times a day (BID) | INTRAVENOUS | Status: DC
  Administered 2024-10-17 – 2024-10-20 (×5): 3 mL via INTRAVENOUS

## 2024-10-17 MED ORDER — FENTANYL CITRATE (PF) 100 MCG/2ML IJ SOLN
INTRAMUSCULAR | Status: AC
  Filled 2024-10-17: qty 2

## 2024-10-17 MED ORDER — CLOPIDOGREL BISULFATE 75 MG PO TABS
ORAL_TABLET | ORAL | Status: AC
  Filled 2024-10-17: qty 8

## 2024-10-17 MED ORDER — SODIUM CHLORIDE 0.9 % IV SOLN: INTRAVENOUS | Status: AC

## 2024-10-17 MED ORDER — HEPARIN SODIUM (PORCINE) 1000 UNIT/ML IJ SOLN
INTRAMUSCULAR | Status: AC
  Filled 2024-10-17: qty 10

## 2024-10-17 MED ORDER — SODIUM CHLORIDE 0.9% FLUSH
3.0000 mL | INTRAVENOUS | Status: DC | PRN
Start: 2024-10-17 — End: 2024-10-20

## 2024-10-17 MED ORDER — ATORVASTATIN CALCIUM 80 MG PO TABS
80.0000 mg | ORAL_TABLET | Freq: Every day | ORAL | Status: DC
  Administered 2024-10-17 – 2024-10-20 (×4): 80 mg via ORAL
  Filled 2024-10-17 (×4): qty 1

## 2024-10-17 MED ORDER — ACETAMINOPHEN 325 MG PO TABS
650.0000 mg | ORAL_TABLET | ORAL | Status: DC | PRN
  Administered 2024-10-19: 650 mg via ORAL
  Filled 2024-10-17: qty 2

## 2024-10-17 MED ORDER — LABETALOL HCL 5 MG/ML IV SOLN: 10.0000 mg | INTRAVENOUS | Status: AC | PRN

## 2024-10-17 MED ORDER — ONDANSETRON HCL 4 MG/2ML IJ SOLN
4.0000 mg | Freq: Four times a day (QID) | INTRAMUSCULAR | Status: DC | PRN
  Administered 2024-10-17: 4 mg via INTRAVENOUS
  Filled 2024-10-17: qty 2

## 2024-10-17 MED ORDER — HEPARIN (PORCINE) IN NACL 1000-0.9 UT/500ML-% IV SOLN
INTRAVENOUS | Status: DC | PRN
  Administered 2024-10-17 (×2): 500 mL

## 2024-10-17 MED ORDER — CLOPIDOGREL BISULFATE 75 MG PO TABS
75.0000 mg | ORAL_TABLET | Freq: Every day | ORAL | Status: DC
  Administered 2024-10-18 – 2024-10-20 (×3): 75 mg via ORAL
  Filled 2024-10-17 (×3): qty 1

## 2024-10-17 MED ORDER — HEPARIN SODIUM (PORCINE) 1000 UNIT/ML IJ SOLN
INTRAMUSCULAR | Status: DC | PRN
  Administered 2024-10-17: 3000 [IU] via INTRAVENOUS
  Administered 2024-10-17: 2000 [IU] via INTRAVENOUS
  Administered 2024-10-17: 3000 [IU] via INTRAVENOUS

## 2024-10-17 MED ORDER — ASPIRIN 81 MG PO CHEW
81.0000 mg | CHEWABLE_TABLET | Freq: Every day | ORAL | Status: DC
  Administered 2024-10-18 – 2024-10-20 (×3): 81 mg via ORAL
  Filled 2024-10-17 (×3): qty 1

## 2024-10-17 MED ORDER — LABETALOL HCL 5 MG/ML IV SOLN
INTRAVENOUS | Status: DC | PRN
  Administered 2024-10-17: 10 mg via INTRAVENOUS

## 2024-10-17 MED ORDER — SODIUM CHLORIDE 0.9 % IV SOLN: 250.0000 mL | INTRAVENOUS | Status: AC | PRN

## 2024-10-17 MED ORDER — ENOXAPARIN SODIUM 40 MG/0.4ML IJ SOSY
40.0000 mg | PREFILLED_SYRINGE | INTRAMUSCULAR | Status: DC
  Administered 2024-10-17: 40 mg via SUBCUTANEOUS
  Filled 2024-10-17: qty 0.4

## 2024-10-17 MED ORDER — HEPARIN SODIUM (PORCINE) 5000 UNIT/ML IJ SOLN
4000.0000 [IU] | Freq: Once | INTRAMUSCULAR | Status: AC
  Administered 2024-10-17: 4000 [IU] via INTRAVENOUS

## 2024-10-17 MED ORDER — PREGABALIN 25 MG PO CAPS
25.0000 mg | ORAL_CAPSULE | Freq: Two times a day (BID) | ORAL | Status: DC
  Administered 2024-10-17 – 2024-10-20 (×6): 25 mg via ORAL
  Filled 2024-10-17 (×6): qty 1

## 2024-10-17 MED ORDER — CHLORHEXIDINE GLUCONATE CLOTH 2 % EX PADS
6.0000 | MEDICATED_PAD | Freq: Every day | CUTANEOUS | Status: DC
  Administered 2024-10-17 – 2024-10-20 (×3): 6 via TOPICAL

## 2024-10-17 MED ORDER — VERAPAMIL HCL 2.5 MG/ML IV SOLN
INTRAVENOUS | Status: DC | PRN
  Administered 2024-10-17: 2.5 mg via INTRA_ARTERIAL

## 2024-10-17 MED ORDER — FENTANYL CITRATE (PF) 100 MCG/2ML IJ SOLN
INTRAMUSCULAR | Status: DC | PRN
  Administered 2024-10-17: 25 ug via INTRAVENOUS

## 2024-10-17 MED ORDER — LIDOCAINE HCL (PF) 1 % IJ SOLN
INTRAMUSCULAR | Status: DC | PRN
  Administered 2024-10-17: 15 mL
  Administered 2024-10-17: 5 mL

## 2024-10-17 MED ORDER — MORPHINE SULFATE (PF) 2 MG/ML IV SOLN: 1.0000 mg | INTRAVENOUS | Status: DC | PRN

## 2024-10-17 MED ORDER — ASPIRIN 81 MG PO CHEW
324.0000 mg | CHEWABLE_TABLET | Freq: Once | ORAL | Status: AC
  Administered 2024-10-17: 324 mg via ORAL

## 2024-10-17 MED ORDER — LABETALOL HCL 5 MG/ML IV SOLN
INTRAVENOUS | Status: AC
  Filled 2024-10-17: qty 4

## 2024-10-17 MED ORDER — IRBESARTAN 150 MG PO TABS: 150.0000 mg | ORAL_TABLET | Freq: Every day | ORAL | Status: DC

## 2024-10-17 MED ORDER — VERAPAMIL HCL 2.5 MG/ML IV SOLN
INTRAVENOUS | Status: AC
  Filled 2024-10-17: qty 2

## 2024-10-17 MED ORDER — FUROSEMIDE 10 MG/ML IJ SOLN
40.0000 mg | Freq: Once | INTRAMUSCULAR | Status: AC
  Administered 2024-10-17: 40 mg via INTRAVENOUS
  Filled 2024-10-17: qty 4

## 2024-10-17 MED ORDER — PANTOPRAZOLE SODIUM 40 MG PO TBEC: 40.0000 mg | DELAYED_RELEASE_TABLET | Freq: Every day | ORAL | Status: DC

## 2024-10-17 MED ORDER — HYDRALAZINE HCL 20 MG/ML IJ SOLN: 10.0000 mg | INTRAMUSCULAR | Status: AC | PRN

## 2024-10-17 MED ORDER — LIDOCAINE HCL 1 % IJ SOLN
INTRAMUSCULAR | Status: AC
  Filled 2024-10-17: qty 20

## 2024-10-17 MED ORDER — CLOPIDOGREL BISULFATE 75 MG PO TABS
ORAL_TABLET | ORAL | Status: DC | PRN
  Administered 2024-10-17: 600 mg via ORAL

## 2024-10-17 MED ORDER — NITROGLYCERIN 1 MG/10 ML FOR IR/CATH LAB
INTRA_ARTERIAL | Status: AC
Start: 2024-10-17 — End: 2024-10-17
  Filled 2024-10-17: qty 10

## 2024-10-17 MED ORDER — DULOXETINE HCL 30 MG PO CPEP
30.0000 mg | ORAL_CAPSULE | Freq: Every morning | ORAL | Status: DC
  Administered 2024-10-18 – 2024-10-20 (×3): 30 mg via ORAL
  Filled 2024-10-17 (×3): qty 1

## 2024-10-17 MED ORDER — FLUOXETINE HCL 20 MG PO CAPS
20.0000 mg | ORAL_CAPSULE | Freq: Every day | ORAL | Status: DC
Start: 2024-10-17 — End: 2024-10-17

## 2024-10-17 MED ORDER — MIDAZOLAM HCL 2 MG/2ML IJ SOLN
INTRAMUSCULAR | Status: AC
  Filled 2024-10-17: qty 2

## 2024-10-17 MED ORDER — PANTOPRAZOLE SODIUM 40 MG PO TBEC
40.0000 mg | DELAYED_RELEASE_TABLET | Freq: Every day | ORAL | Status: DC
  Administered 2024-10-17 – 2024-10-19 (×3): 40 mg via ORAL
  Filled 2024-10-17 (×3): qty 1

## 2024-10-17 MED ORDER — CARVEDILOL 3.125 MG PO TABS
3.1250 mg | ORAL_TABLET | Freq: Two times a day (BID) | ORAL | Status: DC
  Administered 2024-10-17 – 2024-10-20 (×6): 3.125 mg via ORAL
  Filled 2024-10-17 (×6): qty 1

## 2024-10-17 MED ORDER — IOHEXOL 300 MG/ML  SOLN
INTRAMUSCULAR | Status: DC | PRN
  Administered 2024-10-17: 165 mL

## 2024-10-17 MED ORDER — NITROGLYCERIN 1 MG/10 ML FOR IR/CATH LAB
INTRA_ARTERIAL | Status: DC | PRN
  Administered 2024-10-17: 200 ug via INTRACORONARY

## 2024-10-17 SURGICAL SUPPLY — 23 items
BALLOON TREK RX 2.25X12 (BALLOONS) IMPLANT
BALLOON ~~LOC~~ TREK NEO RX 3.0X8 (BALLOONS) IMPLANT
CATH INFINITI 5FR ANG PIGTAIL (CATHETERS) IMPLANT
CATH INFINITI JR4 5F (CATHETERS) IMPLANT
CATH LAUNCHER 6FR EBU3.5 (CATHETERS) IMPLANT
DEVICE CLOSURE MYNXGRIP 6/7F (Vascular Products) IMPLANT
DEVICE RAD TR BAND REGULAR (VASCULAR PRODUCTS) IMPLANT
DRAPE BRACHIAL (DRAPES) IMPLANT
GLIDESHEATH SLEND SS 6F .021 (SHEATH) IMPLANT
GUIDEWIRE INQWIRE 1.5J.035X260 (WIRE) IMPLANT
KIT ENCORE 26 ADVANTAGE (KITS) IMPLANT
KIT MICROPUNCTURE VSI 5F STIFF (SHEATH) IMPLANT
PACK CARDIAC CATH (CUSTOM PROCEDURE TRAY) ×1 IMPLANT
SET ATX-X65L (MISCELLANEOUS) IMPLANT
SHEATH AVANTI 6FR X 11CM (SHEATH) IMPLANT
STATION PROTECTION PRESSURIZED (MISCELLANEOUS) IMPLANT
STENT ONYX FRONTIER 2.25X12 (Permanent Stent) IMPLANT
STENT ONYX FRONTIER 2.5X26 (Permanent Stent) IMPLANT
TUBING CIL FLEX 10 FLL-RA (TUBING) IMPLANT
WIRE ASAHI PROWATER 180CM (WIRE) IMPLANT
WIRE GUIDERIGHT .035X150 (WIRE) IMPLANT
WIRE HITORQ VERSACORE ST 145CM (WIRE) IMPLANT
WIRE RUNTHROUGH .014X180CM (WIRE) IMPLANT

## 2024-10-17 NOTE — Consult Note (Signed)
 Cardiology Consultation   Patient ID: Rose Wilkins MRN: [id number]; DOB: 04-05-1936  Admit date: 10/17/2024 Date of Consult: 10/17/2024  PCP:  Cleotilde Oneil FALCON, MD   [facility] Providers Cardiologist: New: Alm Clay, MD        Patient Profile: Rose Wilkins is a 88 y.o. female with a hx of Hypertension, hyperlipidemia and PVCs who is being seen 10/17/2024 for the evaluation of Anterolateral STEMI at the request of Dr. Kreg Mess ER physician.  History of Present Illness: Rose Wilkins was in her usual state of health until the morning of 10/17/2024 when she started noting bilateral hand pain while holding a cereal bowl.  This caused her to present to [facility] ER.  Atypical symptoms noted so an EKG was performed on arrival (12:13 PM) and it showed right bundle branch block with subtle but nondiagnostic ST segment changes in V1 as well as III and aVF.  She was essentially stable with hand pain only and hypertension.  No other symptoms.  8-9 minutes later she noted worsening hand pain but also a general sense of feeling unwell with some pressure underneath her left breast.  She had gone to the bathroom thinking that she was having indigestion, but it was at this time that she noted the chest discomfort.  Her blood pressure was higher but she denied any dyspnea. Nausea without vomiting.  She was not able to really give a number of pain, but did say I think I am having a heart attack .  The follow-up EKG showed extensive 4 mm ST elevations in V2 with subtle 2 mm elevations in V3 and V4 with biphasic changes in V1 but then also 2 to 3 mm in lead I as well as aVL with depressions and T wave inversions in III and aVF.  At this time code STEMI was called at 1224 hrs.  The pager went off at 1228 hrs. and I presented to the ER and saw her at 1232.  She was initially considering not going for with cardiac catheterization, therefore there was a slight delay explaining the procedure  with the risks and benefits as well as alternatives.  After Dr. Nicholaus and myself spoke with her and her daughter confirmed, she agreed to proceed.  She was then brought to the cardiac catheterization lab for emergent catheterization poss PCI thinking of a anterolateral STEMI.  In the ER she was given 3.5 mg aspirin and 4000 units of IV heparin.  By time she arrived in the Cath Lab she was not noticing any more the chest discomfort and only some mild hand pain.   Past Medical History:  Diagnosis Date   Hyperlipidemia    Hypertension     History reviewed. No pertinent surgical history.   Home Medications:  Prior to Admission medications   Medication Sig Start Date End Date Taking?  acetaminophen (TYLENOL) 325 MG tablet Take 325 mg by mouth in the morning and at bedtime.   Yes  DULoxetine (CYMBALTA) 30 MG capsule Take 30 mg by mouth every morning.   Yes  omeprazole (PRILOSEC) 20 MG capsule Take 20 mg by mouth at bedtime.   Yes  pregabalin (LYRICA) 25 MG capsule Take 25 mg by mouth in the morning and at bedtime.   Yes  telmisartan (MICARDIS) 40 MG tablet Take 40 mg by mouth daily. Patient taking differently: Take 40 mg by mouth at bedtime. Takes 20mg  (half of 40mg  tab) 05/18/20  Yes  clonazePAM (KLONOPIN) 0.5 MG tablet  Take 0.25 mg by mouth 2 (two) times daily. Patient not taking: Reported on 10/17/2024 09/07/20    cyanocobalamin 1000 MCG tablet Take 1,000 mcg by mouth as needed.     diltiazem (CARDIZEM CD) 120 MG 24 hr capsule Take 120 mg by mouth at bedtime. Patient not taking: Reported on 10/17/2024 08/03/20    traZODone (DESYREL) 50 MG tablet Take 75 mg by mouth at bedtime. Patient not taking: Reported on 10/17/2024 09/19/20    Apparently she has not been taking the trazodone or fluoxetine.  She may not also be taking the telmisartan.  Scheduled Meds:  [START ON 10/18/2024] aspirin  81 mg Oral Daily   atorvastatin  80 mg Oral Daily   carvedilol  3.125 mg Oral BID WC   Chlorhexidine  Gluconate Cloth  6 each Topical Daily   [START ON 10/18/2024] clopidogrel  75 mg Oral Q breakfast   enoxaparin (LOVENOX) injection  40 mg Subcutaneous Q24H   [START ON 10/18/2024] pantoprazole  40 mg Oral Daily   sodium chloride  flush  3 mL Intravenous Q12H   Continuous Infusions:  sodium chloride  20 mL/hr at 10/17/24 1600   sodium chloride  75 mL/hr at 10/17/24 1600   sodium chloride      PRN Meds: sodium chloride , acetaminophen, hydrALAZINE, labetalol, morphine injection, ondansetron (ZOFRAN) IV, sodium chloride  flush  Allergies:    Allergies  Allergen Reactions   Gabapentin Rash   Sulfa Antibiotics Rash    Onset 07/31/1999.    Social History:   Social History   Socioeconomic History   Marital status: Widowed    Spouse name: Not on file   Number of children: Not on file   Years of education: Not on file   Highest education level: Not on file  Occupational History   Not on file  Tobacco Use   Smoking status: Never   Smokeless tobacco: Never  Substance and Sexual Activity   Alcohol use: Never   Drug use: Not on file   Sexual activity: Not on file  Other Topics Concern   Not on file  Social History Narrative   Not on file   Social Drivers of Health   Financial Resource Strain: Low Risk  (10/18/2023)   Received from [facility]   Overall Financial Resource Strain (CARDIA)    Difficulty of Paying Living Expenses: Not hard at all  Food Insecurity: No Food Insecurity (10/17/2024)   Hunger Vital Sign    Worried About Running Out of Food in the Last Year: Never true    Ran Out of Food in the Last Year: Never true  Transportation Needs: No Transportation Needs (10/17/2024)   PRAPARE - Administrator, Civil Service (Medical): No    Lack of Transportation (Non-Medical): No  Physical Activity: Not on file  Stress: Not on file  Social Connections: Socially Isolated (10/17/2024)   Social Connection and Isolation Panel    Frequency of  Communication with Friends and Family: Never    Frequency of Social Gatherings with Friends and Family: Never    Attends Religious Services: Never    Database Administrator or Organizations: Yes    Attends Banker Meetings: Never    Marital Status: Widowed  Intimate Partner Violence: Not At Risk (10/17/2024)   Humiliation, Afraid, Rape, and Kick questionnaire    Fear of Current or Ex-Partner: No    Emotionally Abused: No    Physically Abused: No    Sexually Abused: No  Family History:   History reviewed. No pertinent family history.  Given her advanced age, family history is not contributory  ROS:  Please see the history of present illness.  In addition to the chest comfort she noted bilateral hand pain as well as nausea with scant vomiting.  Generally a sense of feeling unwell. All other ROS reviewed and negative.     Physical Exam/Data: Vitals:   10/17/24 1246 10/17/24 1450 10/17/24 1500 10/17/24 1616  BP:  133/79 137/85 (!) 150/78  Pulse:  68 (!) 55 (!) 55  Resp:  15 (!) 25 17  Temp:  97.7 F (36.5 C)    TempSrc:  Oral    SpO2: 96% 99% 94% 100%  Weight:  77.2 kg    Height:        Intake/Output Summary (Last 24 hours) at 10/17/2024 1645 Last data filed at 10/17/2024 1600 Gross per 24 hour  Intake 145.48 ml  Output --  Net 145.48 ml      10/17/2024    2:50 PM 10/17/2024   11:48 AM 06/07/2022    7:13 AM  Last 3 Weights  Weight (lbs) 170 lb 3.1 oz 154 lb 160 lb  Weight (kg) 77.2 kg 69.854 kg 72.576 kg     Body mass index is 26.66 kg/m.  General:  Well nourished, well developed, in no obvious distress, but acknowledges that she does not feel well . HEENT: normal Neck: no JVD Vascular: No carotid bruits; Distal pulses 2+ bilaterally Cardiac:  normal S1, S2; RRR; no murmur or rubs but cannot exclude soft S4 gallop. Lungs:  clear to auscultation bilaterally, no wheezing, rhonchi or rales-nonlabored breathing. Abd: soft, nontender, no hepatomegaly   Ext: no edema Musculoskeletal:  No deformities, BUE and BLE strength normal and equal Skin: warm and dry  Neuro:  CNs 2-12 intact, no focal abnormalities noted Psych:  Normal affect   EKG:  The diagnostic EKG (at 1221 hrs.) was personally reviewed and demonstrates: Sinus bradycardia rate of 51 with 4 to 5 mm ST elevations in V2, 2 to 3 mm durations and removal 1 and aVL with 1 to 2 mm exudations in V3 and V4 with biphasic changes in V1.  ST depressions with T wave version noted in III and aVF. Telemetry:  Telemetry was personally reviewed and demonstrates: Sinus rhythm sinus bradycardia  Relevant CV Studies: None  Laboratory Data: High Sensitivity Troponin:   Recent Labs  Lab 10/17/24 1213  TROPONINIHS 53*     Chemistry Recent Labs  Lab 10/17/24 1213  NA 134*  K 4.5  CL 101  CO2 23  GLUCOSE 172*  BUN 31*  CREATININE 1.32*  CALCIUM 8.8*  GFRNONAA 39*  ANIONGAP 10    No results for input(s): PROT, ALBUMIN, AST, ALT, ALKPHOS, BILITOT in the last 168 hours. Lipids No results for input(s): CHOL, TRIG, HDL, LABVLDL, LDLCALC, CHOLHDL in the last 168 hours.  Hematology Recent Labs  Lab 10/17/24 1213 10/17/24 1540  WBC 5.7 10.4  RBC 3.80* 3.87  HGB 12.4 12.4  HCT 37.1 37.8  MCV 97.6 97.7  MCH 32.6 32.0  MCHC 33.4 32.8  RDW 13.2 13.2  PLT 211 229   Thyroid No results for input(s): TSH, FREET4 in the last 168 hours.  BNPNo results for input(s): BNP, PROBNP in the last 168 hours.  DDimer No results for input(s): DDIMER in the last 168 hours.  Radiology/Studies:  CARDIAC CATHETERIZATION Result Date: 10/17/2024 Table formatting from the original result was not included. Images  from the original result were not included.   CULPRIT LESION #1: Prox LAD to Mid LAD lesion is 90% stenosed with 50% stenosed side branch in 1st Diag. Mid LAD lesion is 100% stenosed, after 1st Diag; TIMI 0 flow in LAD   Angioplasty was performed in the diagonal  sidebranch prestent => Post intervention, the side branch was reduced to 20% residual stenosis.   A drug-eluting stent was successfully placed crossing the diagonal sidebranch, using a STENT ONYX FRONTIER 2.5X26 in the main branch.  Stent was deployed to 2.75 mm  & POT proximally to 3.0 mm. Post intervention, there is a 0% residual stenosis.  TIMI-3 flow restored   LESION #2: 1st Diag lesion is 80% stenosed.   A drug-eluting stent was successfully placed using a STENT ONYX FRONTIER 2.25X12, deployed to 2.4 mm..Post intervention, there is a 0% residual stenosis.  TIMI-3 flow maintained   ------------------------------------   Mid RCA lesion is 60% stenosed.   -----------------------------------   There is mild left ventricular systolic dysfunction. The left ventricular ejection fraction is 45-50% by visual estimate.   LV end diastolic pressure is severely elevated. There is no aortic valve stenosis. Diagnostic Dominance: Right     Intervention        Left Ventriculogram    Anteroapical Hypokinesis-EF 45% Culprit lesion LAD-D1: Severe bifurcation LAD-D1 disease: 100% LAD at D1 with 80% proximal D1 -> Successful PTCA-DES PCI of the 80% D1 (Onyx Frontier DES 2.25 mm x 12 mm deployed to 2.4 mm) lesion that did not involve the ostium followed by Successful DES PCI of the LAD (Onyx Frontier DES 2.5 mg x 26 mm deployed to 2.75 mm and postdilated proximally to 3.0 mm)  The ostium of the diagonal branch was not involved. Lesions reduced to 0%. TIMI-3 flow restored. Modest 60% mid RCA stenosis but otherwise no significant disease. EF likely at least 45 to 50% with possible apical anterior hypokinesis and severely elevated LVEDP. RECOMMENDATIONS: Admit to inpatient ICU.  [facility]   In the absence of any other complications or medical issues, we expect the patient to be ready for discharge from an interventional cardiology perspective on 10/19/2024.   Will convert from diltiazem to carvedilol 3.125 mg  twice daily, add atorvastatin 80 mg daily, and continue ARB Due to elevated LVEDP will treat with 40 mg IV Lasix.  Will check 2D Echocardiogram to better assess EF and filling pressures.   Recommend uninterrupted dual antiplatelet therapy with Aspirin 81mg  daily and Clopidogrel 75mg  daily for a minimum of 12 months (ACS-Class I recommendation).   Anticipate continuing Plavix long-term Alm MICAEL Clay, MD, MS Alm Clay, M.D., M.S. Interventional Cardiologist [facility] Pager # [phone]   Diagnostic    Dominance: Right                                                  Intervention                                                                    Left Ventriculogram :  Anteroapical Hypokinesis-EF 45%  Assessment and Plan: Anterolateral STEMI: Taken to the Cath Lab found to have an occluded LAD at D1 with additional lesion in D1.  With angioplasty of the first lesion this was apparently a bifurcation where the LAD continued on.  The diagonal was stented and then the LAD stented across the diagonal at the occlusion site restoring TIMI-3 flow.  Symptoms essentially resolved upon completion of PCI. CAD-PCI: ASA 81 mg/Plavix 75 mg daily as DAPT x 1 year, at which point would stop aspirin and continue Plavix High-dose atorvastatin 80 mg ordered and diltiazem converted to carvedilol 3.125 mg twice daily; determine whether or not to restart ARB based on BP in the morning Check 2D echocardiogram: LVEDP was elevated in the Cath Lab of roughly 30 mmHg.  She was given 40 mg IV Lasix post cath Systemic hypertension Convert from diltiazem to carvedilol 3.125 mg twice daily with PRNs for hypertension She apparently had not been taking the ARB, will reassess her blood pressure to determine if we need to restart at a lower dose in the morning. Hyperlipidemia High-dose atorvastatin 80 mg ordered Lipid panel ordered along with A1c.  Dispo: Admitted to ICU  under hospitalist service.   TR band removal per protocol, and bedrest post Mynx closure device for femoral access.   Will need to do home med reconciliation to confirm medicines that she is indeed taking.  Will check 2D echocardiogram.   [facility] Will Follow along.  Risk Assessment/Risk Scores:    TIMI Risk Score for ST  Elevation MI:   The patient's TIMI risk score is 7, which indicates a 23.4% risk of all cause mortality at 30 days.       For questions or updates, please contact [facility] Please consult [email or web address] for contact info under         Signed, Alm Clay, MD  10/17/2024 4:45 PM

## 2024-10-17 NOTE — ED Notes (Signed)
 MD Nicholaus and MD Anner

## 2024-10-17 NOTE — ED Notes (Addendum)
 Pt transported to cath lab at this time.

## 2024-10-17 NOTE — ED Triage Notes (Signed)
 Patient states bilateral hand pain that started this morning while holding a bowl of cereal; history of neuropathy but states this feeling is different.

## 2024-10-17 NOTE — Progress Notes (Signed)
 1745 while getting up to the bathroom patient had 15 beat v-run then decided to hold her breath because she felt like she was going to vomit and then went brady to 39 after getting her to take breath her heart rate recovered.

## 2024-10-17 NOTE — ED Notes (Signed)
 Pt on defib pads at this time. MDs discussing PCI at this time with pt.

## 2024-10-17 NOTE — ED Notes (Signed)
 After coming out of restroom, pt c/o pain under left breast. Stating I think I'm having a heart attack. Pt taking to triage room for EKG and blood work.

## 2024-10-17 NOTE — ED Notes (Signed)
 Called [facility] spoke to [place] activated stemi  1224

## 2024-10-17 NOTE — H&P (Addendum)
 [facility] History and Physical   Patient: Rose Wilkins FMW:[id number]   PCP: Rose Oneil FALCON, MD DOB: 1936-12-08   DOA: 10/17/2024   DOS: 10/17/2024   DOS: the patient was seen and examined on 10/17/2024  Patient coming from: The patient is coming from Home  Chief Complaint: Pain in the hand, and then she had chest pain in the triage  HPI: Rose Wilkins is a 88 y.o. female with Past medical history of HTN, HLD, PVCs, peripheral neuropathy, GERD, Depression, as reviewed from EMR, presented at [facility] ED with complaining of pain in the hand and she had chest pain in the triage.  Patient was found to have a STEMI on EKG, cardiology was consulted, patient was taken to the cardiac Cath Lab, s/p PCI, 2 stents were placed.  Patient was transferred to ICU after cardiac cath.  TRH was consulted for admission and further management as below.   ED Course: VS temp 98.3, HR 54, RR 18, BP 142/70, 99% on RA BMP: Sodium 132, low, CO2 20, glucose 129 elevated, creatinine 1.2 elevated, Troponin 4864 LDL 171 CBC within normal range   Review of Systems: as mentioned in the history of present illness.  All other systems reviewed and are negative.  Past Medical History:  Diagnosis Date   Hyperlipidemia    Hypertension    History reviewed. No pertinent surgical history. Social History:  reports that she has never smoked. She has never used smokeless tobacco. She reports that she does not drink alcohol. No history on file for drug use.  Allergies  Allergen Reactions   Gabapentin Rash   Sulfa Antibiotics Rash    Onset 07/31/1999.     Family history reviewed and not pertinent History reviewed. No pertinent family history.   Prior to Admission medications   Medication Sig Start Date End Date Taking? Authorizing Provider  acetaminophen (TYLENOL) 500 MG tablet Take by mouth.    [provider]  clonazePAM (KLONOPIN) 0.5 MG tablet Take 0.25 mg by mouth 2 (two) times daily.  09/07/20   [provider]  cyanocobalamin 1000 MCG tablet Take by mouth.    [provider]  diltiazem (CARDIZEM CD) 120 MG 24 hr capsule Take 120 mg by mouth at bedtime. 08/03/20   [provider]  FLUoxetine (PROZAC) 20 MG capsule Take by mouth. 02/21/20 02/20/21  [provider]  lansoprazole (PREVACID) 30 MG capsule Take by mouth.    [provider]  telmisartan (MICARDIS) 40 MG tablet Take 40 mg by mouth daily. 05/18/20   [provider]  traZODone (DESYREL) 50 MG tablet Take 75 mg by mouth at bedtime. 09/19/20   [provider]    Physical Exam: Vitals:   10/17/24 1235 10/17/24 1236 10/17/24 1246 10/17/24 1450  BP:    133/79  Pulse: 62 63  68  Resp: 20 (!) 23  15  Temp:    97.7 F (36.5 C)  TempSrc:    Oral  SpO2: 98% 98% 96% 99%  Weight:    77.2 kg  Height:        General: alert and oriented to time, place, and person. Appear in mild distress, affect appropriate Eyes: PERRLA, Conjunctiva normal ENT: Oral Mucosa Clear, moist  Neck: no JVD, no Abnormal Mass Or lumps Cardiovascular: S1 and S2 Present, no Murmur, peripheral pulses symmetrical Respiratory: good respiratory effort, Bilateral Air entry equal and Decreased, no signs of accessory muscle use, Clear to Auscultation, no Crackles, no wheezes Abdomen: Bowel  Sound present, Soft and no tenderness, no hernia Skin: no rashes  Extremities: no Pedal edema, no calf tenderness Neurologic: without any new focal findings Gait not checked due to patient safety concerns  Data Reviewed: I have personally reviewed and interpreted labs, imaging as discussed below.  CBC: Recent Labs  Lab 10/17/24 1213  WBC 5.7  HGB 12.4  HCT 37.1  MCV 97.6  PLT 211   Basic Metabolic Panel: Recent Labs  Lab 10/17/24 1213  NA 134*  K 4.5  CL 101  CO2 23  GLUCOSE 172*  BUN 31*  CREATININE 1.32*  CALCIUM 8.8*   GFR: Estimated Creatinine Clearance: 32.7 mL/min (A) (by C-G  formula based on SCr of 1.32 mg/dL (H)). Liver Function Tests: No results for input(s): AST, ALT, ALKPHOS, BILITOT, PROT, ALBUMIN in the last 168 hours. No results for input(s): LIPASE, AMYLASE in the last 168 hours. No results for input(s): AMMONIA in the last 168 hours. Coagulation Profile: Recent Labs  Lab 10/17/24 1230  INR 1.0   Cardiac Enzymes: No results for input(s): CKTOTAL, CKMB, CKMBINDEX, TROPONINI in the last 168 hours. BNP (last 3 results) No results for input(s): PROBNP in the last 8760 hours. HbA1C: No results for input(s): HGBA1C in the last 72 hours. CBG: No results for input(s): GLUCAP in the last 168 hours. Lipid Profile: No results for input(s): CHOL, HDL, LDLCALC, TRIG, CHOLHDL, LDLDIRECT in the last 72 hours. Thyroid Function Tests: No results for input(s): TSH, T4TOTAL, FREET4, T3FREE, THYROIDAB in the last 72 hours. Anemia Panel: No results for input(s): VITAMINB12, FOLATE, FERRITIN, TIBC, IRON, RETICCTPCT in the last 72 hours. Urine analysis: No results found for: COLORURINE, APPEARANCEUR, LABSPEC, PHURINE, GLUCOSEU, HGBUR, BILIRUBINUR, KETONESUR, PROTEINUR, UROBILINOGEN, NITRITE, LEUKOCYTESUR  Radiological Exams on Admission: No results found.   I reviewed all nursing notes, pharmacy notes, vitals, pertinent old records.  Assessment/Plan Principal Problem:   Acute ST elevation myocardial infarction (STEMI) of anterolateral wall (HCC) Active Problems:   STEMI (ST elevation myocardial infarction) (HCC)   # Acute ST elevation myocardial infarction (STEMI) of anterolateral wall [facility]): STEMI code was activated and patient was taken to the cardiac Cath Lab. S/p PCI, 2 stents were inserted. Continue DAPT aspirin 81 mg p.o. daily and Plavix 75 mg p.o. daily Lipitor 80 mg p.o. daily, LDL is elevated Started Coreg 3.125 mg as per cards Monitor BP and titrate  medications accordingly Follow cardiology for further management  # Hyponatremia Check serum osmolarity Monitor sodium level daily   # CKD stage 3 a/b Scr 1.2 Monitor urine output and renal functions daily Avoid nephrotoxic medications, use as needed medications  # Peripheral neuropathy, continued Lyrica home dose # Depression, continued Cymbalta # GERD on PPI  Nutrition: Cardiac diet DVT Prophylaxis: Subcutaneous Lovenox  Advance goals of care discussion: Full code   Consults: I personally Discussed with [facility]  Family Communication: family was present at bedside, at the time of interview.  Opportunity was given to ask question and all questions were answered satisfactorily.  Disposition: Admitted as inpatient, step-down unit. Likely to be discharged Home, in 1-2 days when stable and cleared by cardiology.  I have discussed plan of care as described above with RN and patient/family.  Severity of Illness: The appropriate patient status for this patient is INPATIENT. Inpatient status is judged to be reasonable and necessary in order to provide the required intensity of service to ensure the patient's safety. The patient's presenting symptoms, physical exam findings, and initial radiographic and laboratory data in  the context of their chronic comorbidities is felt to place them at high risk for further clinical deterioration. Furthermore, it is not anticipated that the patient will be medically stable for discharge from the hospital within 2 midnights of admission.   * I certify that at the point of admission it is my clinical judgment that the patient will require inpatient hospital care spanning beyond 2 midnights from the point of admission due to high intensity of service, high risk for further deterioration and high frequency of surveillance required.*   Author: ELVAN SOR, MD [facility] 10/17/2024 3:04 PM   To reach On-call, see care teams to locate the  attending and reach out to them via [email or web address]. If 7PM-7AM, please contact night-coverage If you still have difficulty reaching the attending provider, please page the [facility] (Director on Call) for [facility] on amion for assistance.

## 2024-10-17 NOTE — ED Notes (Signed)
 Cardiology at bedside at this time

## 2024-10-17 NOTE — Progress Notes (Signed)
  Chaplain On-Call responded to Code STEMI notification at 1227 hours.  The patient was being transported to the Cath Lab for procedures.  Chaplain met the patient's sister Rock, and assured her of availability as she might need.  Chaplain Bebe Ardean EMERSON Hershal., [facility]

## 2024-10-17 NOTE — ED Notes (Signed)
 2nd EKG completed as requested by Nicholaus, MD- given to MD and STEMI called at that time. Slater, Charge RN made aware and pt placed in room 6. Logan primary RN given report.

## 2024-10-17 NOTE — ED Notes (Signed)
 Pt to room. Pt reporting L chest pain and vomiting at this time. Pt reports hand pain bilaterally.

## 2024-10-17 NOTE — Plan of Care (Signed)

## 2024-10-17 NOTE — ED Provider Notes (Signed)
 [facility] Provider Note    Event Date/Time   First MD Initiated Contact with Patient 10/17/24 1223     (approximate)   History   Hand Pain   HPI  Rose Wilkins is a 88 y.o. female with anemia, PVCs, hypertension who presents with bilateral hand pain.  Hand pain initiated this morning while she was holding a cereal bowl.  Denies previous history of similar symptoms.  Patient apparently was initially placed in triage and came out of the bathroom and stated that she thought she was having a heart attack with chest pain.  On my arrival to assess her she is holding her chest and having active emesis.  She presents with her sister who contributes to history.  She denies any previous history of CAD      Physical Exam   Triage Vital Signs: ED Triage Vitals [10/17/24 1148]  Encounter Vitals Group     BP 132/70     Girls Systolic BP Percentile      Girls Diastolic BP Percentile      Boys Systolic BP Percentile      Boys Diastolic BP Percentile      Pulse Rate (!) 54     Resp 18     Temp 98.3 F (36.8 C)     Temp Source Oral     SpO2 99 %     Weight 154 lb (69.9 kg)     Height 5' 7 (1.702 m)     Head Circumference      Peak Flow      Pain Score 9     Pain Loc      Pain Education      Exclude from Growth Chart     Most recent vital signs: Vitals:   10/17/24 1246 10/17/24 1450  BP:  133/79  Pulse:  68  Resp:  15  Temp:  97.7 F (36.5 C)  SpO2: 96% 99%    Nursing Triage Note reviewed. Vital signs reviewed and patients oxygen saturation is normoxic  General: Patient is well nourished, well developed, awake and alert, appears unwell Head: Normocephalic and atraumatic Eyes: Normal inspection, extraocular muscles intact, no conjunctival pallor Ear, nose, throat: Normal external exam Neck: Normal range of motion Respiratory: Patient is in no respiratory distress, lungs CTAB Cardiovascular: Patient is not tachycardic, RR GI: Abd SNT with  no guarding or rebound  Back: Normal inspection of the back with good strength and range of motion throughout all ext Extremities: pulses intact with good cap refills, no LE pitting edema or calf tenderness Neuro: The patient is alert and oriented to person, place, and time, appropriately conversive, with 5/5 bilat UE/LE strength, no gross motor or sensory defects noted. Coordination appears to be adequate. Skin: Warm, dry, and intact Psych: normal mood and affect, no SI or HI  ED Results / Procedures / Treatments   Labs (all labs ordered are listed, but only abnormal results are displayed) Labs Reviewed  BASIC METABOLIC PANEL WITH GFR - Abnormal; Notable for the following components:      Result Value   Sodium 134 (*)    Glucose, Bld 172 (*)    BUN 31 (*)    Creatinine, Ser 1.32 (*)    Calcium 8.8 (*)    GFR, Estimated 39 (*)    All other components within normal limits  CBC - Abnormal; Notable for the following components:   RBC 3.80 (*)    All other components  within normal limits  TROPONIN I (HIGH SENSITIVITY) - Abnormal; Notable for the following components:   Troponin I (High Sensitivity) 53 (*)    All other components within normal limits  MRSA NEXT GEN BY PCR, NASAL  PROTIME-INR  APTT  CBC WITH DIFFERENTIAL/PLATELET  COMPREHENSIVE METABOLIC PANEL WITH GFR  LIPID PANEL  CBC WITH DIFFERENTIAL/PLATELET  CG4 I-STAT (LACTIC ACID)  POCT ACTIVATED CLOTTING TIME  POCT ACTIVATED CLOTTING TIME  POCT ACTIVATED CLOTTING TIME  POCT ACTIVATED CLOTTING TIME  I-STAT CG4 LACTIC ACID, ED  TROPONIN I (HIGH SENSITIVITY)  TROPONIN I (HIGH SENSITIVITY)     EKG EKG and rhythm strip are interpreted by myself:   EKG: [Normal sinus rhythm] at heart rate of 54, normal QRS duration, QTc 430, nonspecific ST segments and T waves no ectopy EKG not consistent with Acute STEMI Rhythm strip: NSR in lead II Patient has new ST elevations and V1 and in lead III.  Does not meet criteria for  STEMI however will repeat EKG ordered for 10 minutes  EKG and rhythm strip are interpreted by myself:   EKG: [Normal sinus rhythm] at heart rate of 51, normal QRS duration, QTc 427, nonspecific ST segments and T waves no ectopy EKG consistent with Acute STEMI Rhythm strip: NSR in lead II Patient has profound ST elevations in aVL V2 V3 and deep depressions in inferior leads consistent with STEMI and STEMI alert activated   RADIOLOGY None   PROCEDURES:  Critical Care performed: No  Procedures   MEDICATIONS ORDERED IN ED: Medications  0.9 %  sodium chloride  infusion ( Intravenous Infusion Verify 10/17/24 1500)  pantoprazole (PROTONIX) EC tablet 40 mg (has no administration in time range)  carvedilol (COREG) tablet 3.125 mg (has no administration in time range)  labetalol (NORMODYNE) injection 10 mg (has no administration in time range)  hydrALAZINE (APRESOLINE) injection 10 mg (has no administration in time range)  acetaminophen (TYLENOL) tablet 650 mg (has no administration in time range)  ondansetron (ZOFRAN) injection 4 mg (has no administration in time range)  0.9 %  sodium chloride  infusion ( Intravenous Infusion Verify 10/17/24 1500)  sodium chloride  flush (NS) 0.9 % injection 3 mL (has no administration in time range)  sodium chloride  flush (NS) 0.9 % injection 3 mL (has no administration in time range)  0.9 %  sodium chloride  infusion (has no administration in time range)  aspirin chewable tablet 81 mg (has no administration in time range)  clopidogrel (PLAVIX) tablet 75 mg (has no administration in time range)  atorvastatin (LIPITOR) tablet 80 mg (80 mg Oral Given 10/17/24 1520)  morphine (PF) 2 MG/ML injection 1 mg (has no administration in time range)  Chlorhexidine Gluconate Cloth 2 % PADS 6 each (6 each Topical Given 10/17/24 1454)  enoxaparin (LOVENOX) injection 40 mg (has no administration in time range)  aspirin chewable tablet 324 mg (324 mg Oral Given 10/17/24  1228)  heparin injection 4,000 Units (4,000 Units Intravenous Given 10/17/24 1236)  furosemide (LASIX) injection 40 mg (40 mg Intravenous Given 10/17/24 1520)     IMPRESSION / MDM / ASSESSMENT AND PLAN / ED COURSE                                Differential diagnosis includes, but is not limited to, acute ACS, electrolyte derangement anemia, dissection   ED course: Patient initially was triaged to the waiting room for bilateral hand pain.  When I saw  the initial EKG with new ST elevations in lead III, decision made to obtain a repeat EKG within 10 minutes.  Repeat EKG was clearly consistent with STEMI and Cath Lab activated.  Patient brought to room 6 and IV initiated along with 324 mg of aspirin and a bolus of heparin.  Cath attending Dr. Anner at bedside and he will take the patient to the Cath Lab.  Patient in agreement with the plan patient left expeditiously with many labs and imaging pending.   Clinical Course as of 10/17/24 1613  Thu Oct 17, 2024  1230 Second EKG consistent with STEMI made a STEMI alert 12:24 [HD]    Clinical Course User Index [HD] Nicholaus Rolland BRAVO, MD   -- Risk: 5 This patient has a high risk of morbidity due to further diagnostic testing or treatment. Rationale: This patient's evaluation and management involve a high risk of morbidity due to the potential severity of presenting symptoms, need for diagnostic testing, and/or initiation of treatment that may require close monitoring. The differential includes conditions with potential for significant deterioration or requiring escalation of care. Treatment decisions in the ED, including medication administration, procedural interventions, or disposition planning, reflect this level of risk. COPA: 5 The patient has the following acute or chronic illness/injury that poses a possible threat to life or bodily function: [X] : The patient has a potentially serious acute condition or an acute exacerbation of a chronic  illness requiring urgent evaluation and management in the Emergency Department. The clinical presentation necessitates immediate consideration of life-threatening or function-threatening diagnoses, even if they are ultimately ruled out.   FINAL CLINICAL IMPRESSION(S) / ED DIAGNOSES   Final diagnoses:  ST elevation myocardial infarction involving left anterior descending (LAD) coronary artery [facility])     Rx / DC Orders   ED Discharge Orders          Ordered    AMB Referral to Cardiac Rehabilitation - Phase II        10/17/24 1426             Note:  This document was prepared using Dragon voice recognition software and may include unintentional dictation errors.   Nicholaus Rolland BRAVO, MD 10/17/24 [phone]

## 2024-10-18 ENCOUNTER — Inpatient Hospital Stay (HOSPITAL_COMMUNITY)
Admit: 2024-10-18 | Discharge: 2024-10-18 | Disposition: A | Discharge status: Home / Self Care | Attending: Cardiovascular Disease | Admitting: Cardiovascular Disease

## 2024-10-18 ENCOUNTER — Other Ambulatory Visit: Payer: Self-pay

## 2024-10-18 ENCOUNTER — Inpatient Hospital Stay

## 2024-10-18 DIAGNOSIS — I2109 ST elevation (STEMI) myocardial infarction involving other coronary artery of anterior wall: Secondary | ICD-10-CM

## 2024-10-18 DIAGNOSIS — R079 Chest pain, unspecified: Secondary | ICD-10-CM

## 2024-10-18 DIAGNOSIS — I4510 Unspecified right bundle-branch block: Secondary | ICD-10-CM

## 2024-10-18 LAB — ECHOCARDIOGRAM COMPLETE
AR max vel: 3.7 cm2
AV Area VTI: 5.49 cm2
AV Area mean vel: 4.63 cm2
AV Mean grad: 5 mmHg
AV Peak grad: 8.9 mmHg
Ao pk vel: 1.49 m/s
Area-P 1/2: 2.41 cm2
Height: 67 in
MV VTI: 2.61 cm2
S' Lateral: 2.3 cm
Weight: 2723.12 [oz_av]

## 2024-10-18 LAB — BASIC METABOLIC PANEL WITH GFR
Anion gap: 11 (ref 5–15)
BUN: 35 mg/dL — ABNORMAL HIGH (ref 8–23)
CO2: 21 mmol/L — ABNORMAL LOW (ref 22–32)
Calcium: 8.4 mg/dL — ABNORMAL LOW (ref 8.9–10.3)
Chloride: 103 mmol/L (ref 98–111)
Creatinine, Ser: 1.55 mg/dL — ABNORMAL HIGH (ref 0.44–1.00)
GFR, Estimated: 32 mL/min — ABNORMAL LOW (ref >60)
Glucose, Bld: 108 mg/dL — ABNORMAL HIGH (ref 70–99)
Potassium: 4.1 mmol/L (ref 3.5–5.1)
Sodium: 135 mmol/L (ref 135–145)

## 2024-10-18 LAB — CBC
HCT: 31.9 % — ABNORMAL LOW (ref 36.0–46.0)
Hemoglobin: 11 g/dL — ABNORMAL LOW (ref 12.0–15.0)
MCH: 33.1 pg (ref 26.0–34.0)
MCHC: 34.5 g/dL (ref 30.0–36.0)
MCV: 96.1 fL (ref 80.0–100.0)
Platelets: 221 K/uL (ref 150–400)
RBC: 3.32 MIL/uL — ABNORMAL LOW (ref 3.87–5.11)
RDW: 13.2 % (ref 11.5–15.5)
WBC: 8.9 K/uL (ref 4.0–10.5)
nRBC: 0 % (ref 0.0–0.2)

## 2024-10-18 LAB — MAGNESIUM: Magnesium: 2.1 mg/dL (ref 1.7–2.4)

## 2024-10-18 LAB — PHOSPHORUS: Phosphorus: 4.1 mg/dL (ref 2.5–4.6)

## 2024-10-18 MED ORDER — ENOXAPARIN SODIUM 30 MG/0.3ML IJ SOSY
30.0000 mg | PREFILLED_SYRINGE | INTRAMUSCULAR | Status: DC
  Administered 2024-10-18: 30 mg via SUBCUTANEOUS
  Filled 2024-10-18: qty 0.3

## 2024-10-18 NOTE — Hospital Course (Addendum)
 Hospital course / significant events:   Rose Wilkins is a 88 y.o. female with Past medical history of HTN, HLD, PVCs, peripheral neuropathy, GERD, Depression, as reviewed from EMR, presented at [facility] ED with complaining of pain in the hand and she had chest pain in the triage.  Patient was found to have a STEMI on EKG, cardiology was consulted, patient was taken to the cardiac Cath Lab, s/p PCI, 2 stents were placed. Patient was transferred to ICU after cardiac cath and admitted to hospitalist service. AKI likely d/t lasix / cath, improving though not quite back to baseline, added RN to home health to ensure blood draw in the next 3-4 days for close monitoring.     Consultants:  Cardiology   Procedures/Surgeries: 10/17/24 cardiac cath       ASSESSMENT & PLAN:   Anterolateral STEMI S/p PCI to LAD  Hyperlipidemia / atherosclerosis  ASA 81 mg/Plavix 75 mg daily as DAPT x 1 year, at which point would stop aspirin and continue Plavix atorvastatin 80 mg   carvedilol 3.125 mg twice daily ARB holding for now d/t renal function Cardiology follow up and cardiac rehab   AKI on CKD stage 3 a/b Likely d/t contrast w/ cath and lasix received post-cath  Scr 1.2 on admission 10/30 --> 1.55 10/31 --> 1.63 11/01 --> 1.5 11/02 Monitor renal function outpatient  Avoid nephrotoxic medications Stop NSAID at home   Essential HTN  diltiazem converted to carvedilol 3.125 mg twice daily ARB holding for now d/t renal fxn   Hyponatremia - resolved Monitor sodium level / BMP   Lower back pain - suspect disc disease +/- subacute compression fracture  Compression fracture undetermined chronicity: XR (+)Mild compression fracture at T11 and through the superior endplate of L1, age indeterminate - clinically significant imaging finding  Describes bending last month and felt pop, now w/ difficulty walking, no numbness or focal weakness Of note pt was taking motrin eery 4 hours or so over few days  preceding her presentation to hospital w/ STEMI PT/OT Outpatient follow up  Pain control - avoid NSAID< pt has been educated on this esp w/ AKI and STEMI  Peripheral neuropathy Lyrica home dose  Depression Cymbalta  GERD  PPI     overweight based on BMI: Body mass index is 26.66 kg/m.[name] Significantly low or high BMI is associated with higher medical risk.  Underweight - under 18  overweight - 25 to 29 obese - 30 or more Class 1 obesity: BMI of 30.0 to 34 Class 2 obesity: BMI of 35.0 to 39 Class 3 obesity: BMI of 40.0 to 49 Super Morbid Obesity: BMI 50-59 Super-super Morbid Obesity: BMI 60+ Healthy nutrition and physical activity advised as adjunct to other disease management and risk reduction treatments    DVT prophylaxis: ambulation IV fluids: no continuous IV fluids  Nutrition: cardiac Central lines / other devices: none  Code Status: FULL CODE ACP documentation reviewed:  none on file in [facility]  TOC needs: TBD based on PT eval  Medical barriers to dispo: AKI, PT eval. Expected medical readiness for discharge tomorrow.

## 2024-10-18 NOTE — Progress Notes (Signed)
 PROGRESS NOTE    Rose Wilkins   FMW:[id number] DOB: 25-Feb-1936  DOA: 10/17/2024 Date of Service: 10/18/24 which is hospital day 1  PCP: Rose Oneil FALCON, MD    Hospital course / significant events:   Rose Wilkins is a 88 y.o. female with Past medical history of HTN, HLD, PVCs, peripheral neuropathy, GERD, Depression, as reviewed from EMR, presented at [facility] ED with complaining of pain in the hand and she had chest pain in the triage.  Patient was found to have a STEMI on EKG, cardiology was consulted, patient was taken to the cardiac Cath Lab, s/p PCI, 2 stents were placed. Patient was transferred to ICU after cardiac cath and admitted to hospitalist service.     Consultants:  Cardiology   Procedures/Surgeries: 10/17/24       ASSESSMENT & PLAN:   Anterolateral STEMI S/p PCI to LAD  Hyperlipidemia / atherosclerosis  ASA 81 mg/Plavix 75 mg daily as DAPT x 1 year, at which point would stop aspirin and continue Plavix atorvastatin 80 mg  diltiazem converted to carvedilol 3.125 mg twice daily ARB holding for now d/t renal function 2D echocardiogram pending  Essential HTN  diltiazem converted to carvedilol 3.125 mg twice daily ARB holding for now d/t renal fxn   Hyponatremia - resolved Check serum osmolarity Monitor sodium level daily   AKI on CKD stage 3 a/b Likely d/t contrast w/ cath and lasix received post-cath  Scr 1.2 on admission 10/30 --> 1.55 10/31 Monitor urine output and renal functions daily Avoid nephrotoxic medications, use as needed medications   Lower back pain Describes bending last month and felt pop, now w/ difficulty walking, no numbness or focal weakness DG lumbar PT unable to see until tomorrow   Peripheral neuropathy Lyrica home dose  Depression Cymbalta  GERD  PPI     overweight based on BMI: Body mass index is 26.66 kg/m.Rose Wilkins Significantly low or high BMI is associated with higher medical risk.  Underweight - under 18   overweight - 25 to 29 obese - 30 or more Class 1 obesity: BMI of 30.0 to 34 Class 2 obesity: BMI of 35.0 to 39 Class 3 obesity: BMI of 40.0 to 49 Super Morbid Obesity: BMI 50-59 Super-super Morbid Obesity: BMI 60+ Healthy nutrition and physical activity advised as adjunct to other disease management and risk reduction treatments    DVT prophylaxis: ambulation IV fluids: no continuous IV fluids  Nutrition: cardiac Central lines / other devices: none  Code Status: FULL CODE ACP documentation reviewed:  none on file in [facility]  TOC needs: TBD based on PT eval  Medical barriers to dispo: AKI, PT eval. Expected medical readiness for discharge tomorrow.              Subjective / Brief ROS:  Patient reports low back pain and requests imaging ,states she is unable to walk without pain but denies focal weakness/numbness - was lifting a flower pot few weeks ago and felt pop and has been hurting since then  Denies CP/SOB.  Pain controlled.  Denies new weakness.  Tolerating diet.  Reports no concerns w/ urination/defecation.   Family Communication: none at this time    Objective Findings:  Vitals:   10/18/24 1030 10/18/24 1031 10/18/24 1155 10/18/24 1535  BP:   (!) 98/57 (!) 111/55  Pulse: 92 84 71 69  Resp: 12 18 18  (!) 21  Temp:   98.5 F (36.9 C) 98.6 F (37 C)  TempSrc:   Oral  Oral  SpO2: (!) 86% (!) 85% 94% 94%  Weight:      Height:        Intake/Output Summary (Last 24 hours) at 10/18/2024 1556 Last data filed at 10/18/2024 1400 Gross per 24 hour  Intake 1528.46 ml  Output 1300 ml  Net 228.46 ml   Filed Weights   10/17/24 1148 10/17/24 1450  Weight: 69.9 kg 77.2 kg    Examination:  Physical Exam Constitutional:      General: She is not in acute distress.    Appearance: She is not ill-appearing.  Cardiovascular:     Rate and Rhythm: Normal rate and regular rhythm.  Pulmonary:     Effort: Pulmonary effort is normal.     Breath sounds: Normal  breath sounds.  Abdominal:     General: Bowel sounds are normal.     Palpations: Abdomen is soft.  Musculoskeletal:     Right lower leg: No edema.     Left lower leg: No edema.  Skin:    General: Skin is warm and dry.  Neurological:     Mental Status: She is alert and oriented to person, place, and time.          Scheduled Medications:   aspirin  81 mg Oral Daily   atorvastatin  80 mg Oral Daily   carvedilol  3.125 mg Oral BID WC   Chlorhexidine Gluconate Cloth  6 each Topical Daily   clopidogrel  75 mg Oral Q breakfast   DULoxetine  30 mg Oral q morning   enoxaparin (LOVENOX) injection  30 mg Subcutaneous Q24H   pantoprazole  40 mg Oral QHS   pregabalin  25 mg Oral BID   sodium chloride  flush  3 mL Intravenous Q12H    Continuous Infusions:  sodium chloride       PRN Medications:  sodium chloride , acetaminophen, morphine injection, ondansetron (ZOFRAN) IV, sodium chloride  flush  Antimicrobials from admission:  Anti-infectives (From admission, onward)    None           Data Reviewed:  I have personally reviewed the following...  CBC: Recent Labs  Lab 10/17/24 1213 10/17/24 1540 10/18/24 0558  WBC 5.7 10.4 8.9  NEUTROABS  --  8.4*  --   HGB 12.4 12.4 11.0*  HCT 37.1 37.8 31.9*  MCV 97.6 97.7 96.1  PLT 211 229 221   Basic Metabolic Panel: Recent Labs  Lab 10/17/24 1213 10/17/24 1540 10/18/24 0558  NA 134* 132* 135  K 4.5 4.8 4.1  CL 101 102 103  CO2 23 20* 21*  GLUCOSE 172* 129* 108*  BUN 31* 29* 35*  CREATININE 1.32* 1.20* 1.55*  CALCIUM 8.8* 8.8* 8.4*  MG  --   --  2.1  PHOS  --   --  4.1   GFR: Estimated Creatinine Clearance: 27.9 mL/min (A) (by C-G formula based on SCr of 1.55 mg/dL (H)). Liver Function Tests: Recent Labs  Lab 10/17/24 1540  AST 41  ALT 16  ALKPHOS 67  BILITOT 0.7  PROT 7.5  ALBUMIN 3.7   No results for input(s): LIPASE, AMYLASE in the last 168 hours. No results for input(s): AMMONIA in the last  168 hours. Coagulation Profile: Recent Labs  Lab 10/17/24 1230  INR 1.0   Cardiac Enzymes: No results for input(s): CKTOTAL, CKMB, CKMBINDEX, TROPONINI in the last 168 hours. BNP (last 3 results) No results for input(s): PROBNP in the last 8760 hours. HbA1C: No results for input(s): HGBA1C in the last  72 hours. CBG: Recent Labs  Lab 10/17/24 1442  GLUCAP 108*   Lipid Profile: Recent Labs    10/17/24 1540  CHOL 231*  HDL 49  LDLCALC 171*  TRIG 56  CHOLHDL 4.7   Thyroid Function Tests: No results for input(s): TSH, T4TOTAL, FREET4, T3FREE, THYROIDAB in the last 72 hours. Anemia Panel: No results for input(s): VITAMINB12, FOLATE, FERRITIN, TIBC, IRON, RETICCTPCT in the last 72 hours. Most Recent Urinalysis On File:  No results found for: COLORURINE, APPEARANCEUR, LABSPEC, PHURINE, GLUCOSEU, HGBUR, BILIRUBINUR, KETONESUR, PROTEINUR, UROBILINOGEN, NITRITE, LEUKOCYTESUR Sepsis Labs: @LABRCNTIP (procalcitonin:4,lacticidven:4) Microbiology: Recent Results (from the past 240 hours)  MRSA Next Gen by PCR, Nasal     Status: None   Collection Time: 10/17/24  2:54 PM   Specimen: Nasal Mucosa; Nasal Swab  Result Value Ref Range Status   MRSA by PCR Next Gen NOT DETECTED NOT DETECTED Final    Comment: (NOTE) The GeneXpert MRSA Assay (FDA approved for NASAL specimens only), is one component of a comprehensive MRSA colonization surveillance program. It is not intended to diagnose MRSA infection nor to guide or monitor treatment for MRSA infections. Test performance is not FDA approved in patients less than 67 years old. Performed at [facility], [address] [place], [place] [address]       Radiology Studies last 3 days: ECHOCARDIOGRAM COMPLETE Result Date: 10/18/2024    ECHOCARDIOGRAM REPORT   Patient Name:   KENDALYN CRANFIELD Date of Exam: 10/18/2024 Medical Rec #:  [id number]        Height:       67.0 in  Accession #:    [id number]       Weight:       170.2 lb Date of Birth:  1936-02-17         BSA:          1.888 m Patient Age:    88 years         BP:           137/57 mmHg Patient Gender: F                HR:           88 bpm. Exam Location:  [facility] Procedure: 2D Echo, Cardiac Doppler and Color Doppler (Both Spectral and Color            Flow Doppler were utilized during procedure). Indications:     Acute myocardial infarction, unspecified I21.9  History:         Patient has no prior history of Echocardiogram examinations.                  Risk Factors:Dyslipidemia and Hypertension.  Sonographer:     Christopher Furnace Referring Phys:  [id number] FLYJFFJI A ARIDA Diagnosing Phys: Redell Cave MD  Sonographer Comments: Suboptimal apical window. IMPRESSIONS  1. Left ventricular ejection fraction, by estimation, is 60 to 65%. The left ventricle has normal function. The left ventricle has no regional wall motion abnormalities. There is mild left ventricular hypertrophy. Left ventricular diastolic parameters are consistent with Grade I diastolic dysfunction (impaired relaxation).  2. Right ventricular systolic function is normal. The right ventricular size is normal.  3. The mitral valve is degenerative. Mild mitral valve regurgitation.  4. The aortic valve is tricuspid. Aortic valve regurgitation is not visualized. FINDINGS  Left Ventricle: Left ventricular ejection fraction, by estimation, is 60 to 65%. The left ventricle has normal function. The left ventricle has no regional  wall motion abnormalities. The left ventricular internal cavity size was normal in size. There is  mild left ventricular hypertrophy. Left ventricular diastolic parameters are consistent with Grade I diastolic dysfunction (impaired relaxation). Right Ventricle: The right ventricular size is normal. No increase in right ventricular wall thickness. Right ventricular systolic function is normal. Left Atrium: Left atrial size was normal in size. Right Atrium:  Right atrial size was normal in size. Pericardium: There is no evidence of pericardial effusion. Mitral Valve: The mitral valve is degenerative in appearance. Mild to moderate mitral annular calcification. Mild mitral valve regurgitation. MV peak gradient, 10.8 mmHg. The mean mitral valve gradient is 4.0 mmHg. Tricuspid Valve: The tricuspid valve is normal in structure. Tricuspid valve regurgitation is not demonstrated. Aortic Valve: The aortic valve is tricuspid. Aortic valve regurgitation is not visualized. Aortic valve mean gradient measures 5.0 mmHg. Aortic valve peak gradient measures 8.9 mmHg. Aortic valve area, by VTI measures 5.49 cm. Pulmonic Valve: The pulmonic valve was not well visualized. Pulmonic valve regurgitation is not visualized. Aorta: The aortic root is normal in size and structure. Venous: The inferior vena cava was not well visualized. IAS/Shunts: No atrial level shunt detected by color flow Doppler.  LEFT VENTRICLE PLAX 2D LVIDd:         3.90 cm   Diastology LVIDs:         2.30 cm   LV e' medial:    6.85 cm/s LV PW:         1.60 cm   LV E/e' medial:  14.5 LV IVS:        1.40 cm   LV e' lateral:   4.46 cm/s LVOT diam:     2.20 cm   LV E/e' lateral: 22.3 LV SV:         115 LV SV Index:   61 LVOT Area:     3.80 cm  RIGHT VENTRICLE RV S prime:     12.20 cm/s TAPSE (M-mode): 1.5 cm LEFT ATRIUM           Index LA diam:      3.20 cm 1.69 cm/m LA Vol (A4C): 36.6 ml 19.38 ml/m  AORTIC VALVE AV Area (Vmax):    3.70 cm AV Area (Vmean):   4.63 cm AV Area (VTI):     5.49 cm AV Vmax:           149.00 cm/s AV Vmean:          101.000 cm/s AV VTI:            0.209 m AV Peak Grad:      8.9 mmHg AV Mean Grad:      5.0 mmHg LVOT Vmax:         145.00 cm/s LVOT Vmean:        123.000 cm/s LVOT VTI:          0.302 m LVOT/AV VTI ratio: 1.44  AORTA Ao Root diam: 2.90 cm MITRAL VALVE                TRICUSPID VALVE MV Area (PHT): 2.41 cm     TR Peak grad:   16.8 mmHg MV Area VTI:   2.61 cm     TR Vmax:         205.00 cm/s MV Peak grad:  10.8 mmHg MV Mean grad:  4.0 mmHg     SHUNTS MV Vmax:       1.64 m/s     Systemic VTI:  0.30 m MV Vmean:      95.3 cm/s    Systemic Diam: 2.20 cm MV Decel Time: 315 msec MV E velocity: 99.60 cm/s MV A velocity: 158.00 cm/s MV E/A ratio:  0.63 Redell Cave MD Electronically signed by Redell Cave MD Signature Date/Time: 10/18/2024/12:58:58 PM    Final    CARDIAC CATHETERIZATION Result Date: 10/17/2024 Table formatting from the original result was not included. Images from the original result were not included.   CULPRIT LESION #1: Prox LAD to Mid LAD lesion is 90% stenosed with 50% stenosed side branch in 1st Diag. Mid LAD lesion is 100% stenosed, after 1st Diag; TIMI 0 flow in LAD   Angioplasty was performed in the diagonal sidebranch prestent => Post intervention, the side branch was reduced to 20% residual stenosis.   A drug-eluting stent was successfully placed crossing the diagonal sidebranch, using a STENT ONYX FRONTIER 2.5X26 in the main branch.  Stent was deployed to 2.75 mm  & POT proximally to 3.0 mm. Post intervention, there is a 0% residual stenosis.  TIMI-3 flow restored   LESION #2: 1st Diag lesion is 80% stenosed.   A drug-eluting stent was successfully placed using a STENT ONYX FRONTIER 2.25X12, deployed to 2.4 mm..Post intervention, there is a 0% residual stenosis.  TIMI-3 flow maintained   ------------------------------------   Mid RCA lesion is 60% stenosed.   -----------------------------------   There is mild left ventricular systolic dysfunction. The left ventricular ejection fraction is 45-50% by visual estimate.   LV end diastolic pressure is severely elevated. There is no aortic valve stenosis. Diagnostic Dominance: Right     Intervention        Left Ventriculogram    Anteroapical Hypokinesis-EF 45% Culprit lesion LAD-D1: Severe bifurcation LAD-D1 disease: 100% LAD at D1 with 80% proximal D1 -> Successful PTCA-DES PCI of the 80% D1 (Onyx Frontier DES  2.25 mm x 12 mm deployed to 2.4 mm) lesion that did not involve the ostium followed by Successful DES PCI of the LAD (Onyx Frontier DES 2.5 mg x 26 mm deployed to 2.75 mm and postdilated proximally to 3.0 mm)  The ostium of the diagonal branch was not involved. Lesions reduced to 0%. TIMI-3 flow restored. Modest 60% mid RCA stenosis but otherwise no significant disease. EF likely at least 45 to 50% with possible apical anterior hypokinesis and severely elevated LVEDP. RECOMMENDATIONS: Admit to inpatient ICU.  [facility]   In the absence of any other complications or medical issues, we expect the patient to be ready for discharge from an interventional cardiology perspective on 10/19/2024.   Will convert from diltiazem to carvedilol 3.125 mg twice daily, add atorvastatin 80 mg daily, and continue ARB Due to elevated LVEDP will treat with 40 mg IV Lasix.  Will check 2D Echocardiogram to better assess EF and filling pressures.   Recommend uninterrupted dual antiplatelet therapy with Aspirin 81mg  daily and Clopidogrel 75mg  daily for a minimum of 12 months (ACS-Class I recommendation).   Anticipate continuing Plavix long-term Alm MICAEL Clay, MD, MS Alm Clay, M.D., M.S. Interventional Cardiologist [facility] Pager # [phone]         Laneta Blunt, DO [facility] 10/18/2024, 3:56 PM    Dictation software may have been used to generate the above note. Typos may occur and escape review in typed/dictated notes. Please contact Dr Blunt directly for clarity if needed.  Staff may message me via secure chat in Epic  but this may not receive  an immediate response,  please page me for urgent matters!  If 7PM-7AM, please contact night coverage [email or web address]

## 2024-10-18 NOTE — Evaluation (Signed)
 Occupational Therapy Evaluation Patient Details Name: Rose Wilkins MRN: [id number] DOB: 09/08/1936 Today's Date: 10/18/2024   History of Present Illness   Rose Wilkins is an 88yoF who comes to [facility] on 10/30 with acute onset bilat hand pain, chest pain, EMG changes suggestive of STEMI, pt taken for cardiac cath. Pt taken to ICU s/p proedure, has 1 episode of VT and presyncopal bradycardia followng procedure, but has since been able to AMB in hall with NSG. 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 Pt has support from family at [place] for IADL.    Clinical Impressions Rose Wilkins was seen for OT evaluation this date. Prior to hospital admission, pt was IND. Pt lives alone, daughter and sister available intermittently, daughter planning to stay ~5 days after d/c but will still work. Pt currently requires SBA standing grooming tasks, mild LOB reaching outside BOS. SBA + RW for ADL t/f ~20 ft. SpO2 95% with activity, desat 86% on RA seated after activity. Pt would benefit from skilled OT to address noted impairments and functional limitations (see below for any additional details). Upon hospital discharge, recommend OT follow up.     If plan is discharge home, recommend the following:   Help with stairs or ramp for entrance;A little help with walking and/or transfers     Functional Status Assessment   Patient has had a recent decline in their functional status and demonstrates the ability to make significant improvements in function in a reasonable and predictable amount of time.     Equipment Recommendations   None recommended by OT     Recommendations for Other Services         Precautions/Restrictions   Precautions Precautions: None Recall of Precautions/Restrictions: Intact Restrictions Weight Bearing Restrictions Per Provider Order: No     Mobility Bed Mobility                General bed mobility comments: not tested    Transfers Overall transfer level: Needs assistance Equipment used: Rolling walker (2 wheels) Transfers: Sit to/from Stand Sit to Stand: Supervision                  Balance Overall balance assessment: Needs assistance Sitting-balance support: No upper extremity supported, Feet supported Sitting balance-Leahy Scale: Good     Standing balance support: No upper extremity supported, During functional activity Standing balance-Leahy Scale: Fair                             ADL either performed or assessed with clinical judgement   ADL Overall ADL's : Needs assistance/impaired                                       General ADL Comments: SBA standing grooming tasks, mild LOB reaching outside BOS. SBA + RW for ADL t/f ~20 ft       Pertinent Vitals/Pain Pain Assessment Pain Assessment: No/denies pain     Extremity/Trunk Assessment Upper Extremity Assessment Upper Extremity Assessment: Overall WFL for tasks assessed   Lower Extremity Assessment Lower Extremity Assessment: Generalized weakness       Communication Communication Communication: No apparent difficulties   Cognition Arousal: Alert Behavior During Therapy: WFL for tasks assessed/performed Cognition: No apparent impairments  Following commands: Intact       Cueing  General Comments      SpO2 86% on RA seated after activity   Exercises     Shoulder Instructions      Home Living Family/patient expects to be discharged to:: Private residence Living Arrangements: Alone Available Help at Discharge: Family;Available PRN/intermittently Type of Home: House Home Access: Stairs to enter Entrance Stairs-Number of Steps: 1+1 Entrance Stairs-Rails: None Home Layout: Laundry or work area in basement;Two level;Able to live on main level with bedroom/bathroom                Home Equipment: Rolling Walker (2 wheels);Rollator (4 wheels);Cane - single point;Shower seat          Prior Functioning/Environment Prior Level of Function : Independent/Modified Independent;Driving             Mobility Comments: reports multiple near falls and has fallen on steps. no AD use baseline      OT Problem List: Decreased strength;Decreased range of motion;Decreased activity tolerance;Impaired balance (sitting and/or standing);Decreased safety awareness   OT Treatment/Interventions: Therapeutic exercise;Self-care/ADL training;Energy conservation;DME and/or AE instruction;Therapeutic activities;Patient/family education      OT Goals(Current goals can be found in the care plan section)   Acute Rehab OT Goals Patient Stated Goal: to go home OT Goal Formulation: With patient/family Time For Goal Achievement: 11/01/24 Potential to Achieve Goals: Good ADL Goals Pt Will Perform Grooming: Independently;standing Pt Will Perform Lower Body Dressing: Independently;sitting/lateral leans Pt Will Transfer to Toilet: Independently;ambulating;regular height toilet   OT Frequency:  Min 2X/week    Co-evaluation              AM-PAC OT 6 Clicks Daily Activity     Outcome Measure Help from another person eating meals?: None Help from another person taking care of personal grooming?: None Help from another person toileting, which includes using toliet, bedpan, or urinal?: A Little Help from another person bathing (including washing, rinsing, drying)?: None Help from another person to put on and taking off regular upper body clothing?: None Help from another person to put on and taking off regular lower body clothing?: A Little 6 Click Score: 22   End of Session Equipment Utilized During Treatment: Rolling walker (2 wheels)  Activity Tolerance: Patient tolerated treatment well Patient left: in chair;with call bell/phone within reach;with family/visitor  present  OT Visit Diagnosis: Other abnormalities of gait and mobility (R26.89);Muscle weakness (generalized) (M62.81)                Time: 8448-8382 OT Time Calculation (min): 26 min Charges:  OT General Charges $OT Visit: 1 Visit OT Evaluation $OT Eval Low Complexity: 1 Low OT Treatments $Self Care/Home Management : 8-22 mins  Rose Wilkins, M.S. OTR/L  10/18/24, 4:39 PM  ascom [phone]

## 2024-10-18 NOTE — Progress Notes (Signed)
*  PRELIMINARY RESULTS* Echocardiogram 2D Echocardiogram has been performed.  Floydene Harder 10/18/2024, 10:52 AM

## 2024-10-18 NOTE — Progress Notes (Signed)
 Rounding Note   Patient Name: Rose Wilkins Date of Encounter: 10/18/2024  [facility] Cardiologist: Alm Clay, MD   Subjective   She had an episode of a 15 beat ventricular tachycardia last evening while she was trying to use the bathroom.  She held her breath and had a vagal response after that with a heart rate of 39.  No arrhythmia since then.  She is doing well with no chest pain or shortness of breath.  She is concerned about low back pain after a fall about 2 weeks ago.   Scheduled Meds:  aspirin  81 mg Oral Daily   atorvastatin  80 mg Oral Daily   carvedilol  3.125 mg Oral BID WC   Chlorhexidine Gluconate Cloth  6 each Topical Daily   clopidogrel  75 mg Oral Q breakfast   DULoxetine  30 mg Oral q morning   enoxaparin (LOVENOX) injection  40 mg Subcutaneous Q24H   pantoprazole  40 mg Oral QHS   pregabalin  25 mg Oral BID   sodium chloride  flush  3 mL Intravenous Q12H   Continuous Infusions:  sodium chloride      PRN Meds: sodium chloride , acetaminophen, morphine injection, ondansetron (ZOFRAN) IV, sodium chloride  flush   Vital Signs  Vitals:   10/18/24 1028 10/18/24 1030 10/18/24 1031 10/18/24 1155  BP:    (!) 98/57  Pulse: 75 92 84 71  Resp: (!) 21 12 18 18   Temp:    98.5 F (36.9 C)  TempSrc:    Oral  SpO2: (!) 87% (!) 86% (!) 85% 94%  Weight:      Height:        Intake/Output Summary (Last 24 hours) at 10/18/2024 1440 Last data filed at 10/18/2024 0900 Gross per 24 hour  Intake 1359.04 ml  Output 1300 ml  Net 59.04 ml      10/17/2024    2:50 PM 10/17/2024   11:48 AM 06/07/2022    7:13 AM  Last 3 Weights  Weight (lbs) 170 lb 3.1 oz 154 lb 160 lb  Weight (kg) 77.2 kg 69.854 kg 72.576 kg      Telemetry 15 beat run of ventricular tachycardia last evening.  Sinus rhythm now.- Personally Reviewed  ECG   - Personally Reviewed  Physical Exam  GEN: No acute distress.   Neck: No JVD Cardiac: RRR, no murmurs, rubs, or gallops.   Respiratory: Clear to auscultation bilaterally. GI: Soft, nontender, non-distended  MS: No edema; No deformity. Neuro:  Nonfocal  Psych: Normal affect  Right radial pulses normal with no hematoma.  Labs High Sensitivity Troponin:   Recent Labs  Lab 10/17/24 1213 10/17/24 1540 10/17/24 1847  TROPONINIHS 53* 4,864* 12,477*     Chemistry Recent Labs  Lab 10/17/24 1213 10/17/24 1540 10/18/24 0558  NA 134* 132* 135  K 4.5 4.8 4.1  CL 101 102 103  CO2 23 20* 21*  GLUCOSE 172* 129* 108*  BUN 31* 29* 35*  CREATININE 1.32* 1.20* 1.55*  CALCIUM 8.8* 8.8* 8.4*  MG  --   --  2.1  PROT  --  7.5  --   ALBUMIN  --  3.7  --   AST  --  41  --   ALT  --  16  --   ALKPHOS  --  67  --   BILITOT  --  0.7  --   GFRNONAA 39* 44* 32*  ANIONGAP 10 10 11     Lipids  Recent Labs  Lab 10/17/24 1540  CHOL 231*  TRIG 56  HDL 49  LDLCALC 171*  CHOLHDL 4.7    Hematology Recent Labs  Lab 10/17/24 1213 10/17/24 1540 10/18/24 0558  WBC 5.7 10.4 8.9  RBC 3.80* 3.87 3.32*  HGB 12.4 12.4 11.0*  HCT 37.1 37.8 31.9*  MCV 97.6 97.7 96.1  MCH 32.6 32.0 33.1  MCHC 33.4 32.8 34.5  RDW 13.2 13.2 13.2  PLT 211 229 221   Thyroid No results for input(s): TSH, FREET4 in the last 168 hours.  BNPNo results for input(s): BNP, PROBNP in the last 168 hours.  DDimer No results for input(s): DDIMER in the last 168 hours.   Radiology  ECHOCARDIOGRAM COMPLETE Result Date: 10/18/2024    ECHOCARDIOGRAM REPORT   Patient Name:   Rose Wilkins Date of Exam: 10/18/2024 Medical Rec #:  [id number]        Height:       67.0 in Accession #:    [id number]       Weight:       170.2 lb Date of Birth:  Jun 23, 1936         BSA:          1.888 m Patient Age:    88 years         BP:           137/57 mmHg Patient Gender: F                HR:           88 bpm. Exam Location:  [facility] Procedure: 2D Echo, Cardiac Doppler and Color Doppler (Both Spectral and Color            Flow Doppler were utilized during  procedure). Indications:     Acute myocardial infarction, unspecified I21.9  History:         Patient has no prior history of Echocardiogram examinations.                  Risk Factors:Dyslipidemia and Hypertension.  Sonographer:     Christopher Furnace Referring Phys:  [id number] FLYJFFJI A Tinisha Etzkorn Diagnosing Phys: Redell Cave MD  Sonographer Comments: Suboptimal apical window. IMPRESSIONS  1. Left ventricular ejection fraction, by estimation, is 60 to 65%. The left ventricle has normal function. The left ventricle has no regional wall motion abnormalities. There is mild left ventricular hypertrophy. Left ventricular diastolic parameters are consistent with Grade I diastolic dysfunction (impaired relaxation).  2. Right ventricular systolic function is normal. The right ventricular size is normal.  3. The mitral valve is degenerative. Mild mitral valve regurgitation.  4. The aortic valve is tricuspid. Aortic valve regurgitation is not visualized. FINDINGS  Left Ventricle: Left ventricular ejection fraction, by estimation, is 60 to 65%. The left ventricle has normal function. The left ventricle has no regional wall motion abnormalities. The left ventricular internal cavity size was normal in size. There is  mild left ventricular hypertrophy. Left ventricular diastolic parameters are consistent with Grade I diastolic dysfunction (impaired relaxation). Right Ventricle: The right ventricular size is normal. No increase in right ventricular wall thickness. Right ventricular systolic function is normal. Left Atrium: Left atrial size was normal in size. Right Atrium: Right atrial size was normal in size. Pericardium: There is no evidence of pericardial effusion. Mitral Valve: The mitral valve is degenerative in appearance. Mild to moderate mitral annular calcification. Mild mitral valve regurgitation. MV peak gradient, 10.8 mmHg. The mean mitral valve gradient is 4.0 mmHg.  Tricuspid Valve: The tricuspid valve is normal in structure.  Tricuspid valve regurgitation is not demonstrated. Aortic Valve: The aortic valve is tricuspid. Aortic valve regurgitation is not visualized. Aortic valve mean gradient measures 5.0 mmHg. Aortic valve peak gradient measures 8.9 mmHg. Aortic valve area, by VTI measures 5.49 cm. Pulmonic Valve: The pulmonic valve was not well visualized. Pulmonic valve regurgitation is not visualized. Aorta: The aortic root is normal in size and structure. Venous: The inferior vena cava was not well visualized. IAS/Shunts: No atrial level shunt detected by color flow Doppler.  LEFT VENTRICLE PLAX 2D LVIDd:         3.90 cm   Diastology LVIDs:         2.30 cm   LV e' medial:    6.85 cm/s LV PW:         1.60 cm   LV E/e' medial:  14.5 LV IVS:        1.40 cm   LV e' lateral:   4.46 cm/s LVOT diam:     2.20 cm   LV E/e' lateral: 22.3 LV SV:         115 LV SV Index:   61 LVOT Area:     3.80 cm  RIGHT VENTRICLE RV S prime:     12.20 cm/s TAPSE (M-mode): 1.5 cm LEFT ATRIUM           Index LA diam:      3.20 cm 1.69 cm/m LA Vol (A4C): 36.6 ml 19.38 ml/m  AORTIC VALVE AV Area (Vmax):    3.70 cm AV Area (Vmean):   4.63 cm AV Area (VTI):     5.49 cm AV Vmax:           149.00 cm/s AV Vmean:          101.000 cm/s AV VTI:            0.209 m AV Peak Grad:      8.9 mmHg AV Mean Grad:      5.0 mmHg LVOT Vmax:         145.00 cm/s LVOT Vmean:        123.000 cm/s LVOT VTI:          0.302 m LVOT/AV VTI ratio: 1.44  AORTA Ao Root diam: 2.90 cm MITRAL VALVE                TRICUSPID VALVE MV Area (PHT): 2.41 cm     TR Peak grad:   16.8 mmHg MV Area VTI:   2.61 cm     TR Vmax:        205.00 cm/s MV Peak grad:  10.8 mmHg MV Mean grad:  4.0 mmHg     SHUNTS MV Vmax:       1.64 m/s     Systemic VTI:  0.30 m MV Vmean:      95.3 cm/s    Systemic Diam: 2.20 cm MV Decel Time: 315 msec MV E velocity: 99.60 cm/s MV A velocity: 158.00 cm/s MV E/A ratio:  0.63 Redell Cave MD Electronically signed by Redell Cave MD Signature Date/Time:  10/18/2024/12:58:58 PM    Final    CARDIAC CATHETERIZATION Result Date: 10/17/2024 Table formatting from the original result was not included. Images from the original result were not included.   CULPRIT LESION #1: Prox LAD to Mid LAD lesion is 90% stenosed with 50% stenosed side branch in 1st Diag. Mid LAD lesion is 100% stenosed, after 1st Diag; TIMI  0 flow in LAD   Angioplasty was performed in the diagonal sidebranch prestent => Post intervention, the side branch was reduced to 20% residual stenosis.   A drug-eluting stent was successfully placed crossing the diagonal sidebranch, using a STENT ONYX FRONTIER 2.5X26 in the main branch.  Stent was deployed to 2.75 mm  & POT proximally to 3.0 mm. Post intervention, there is a 0% residual stenosis.  TIMI-3 flow restored   LESION #2: 1st Diag lesion is 80% stenosed.   A drug-eluting stent was successfully placed using a STENT ONYX FRONTIER 2.25X12, deployed to 2.4 mm..Post intervention, there is a 0% residual stenosis.  TIMI-3 flow maintained   ------------------------------------   Mid RCA lesion is 60% stenosed.   -----------------------------------   There is mild left ventricular systolic dysfunction. The left ventricular ejection fraction is 45-50% by visual estimate.   LV end diastolic pressure is severely elevated. There is no aortic valve stenosis. Diagnostic Dominance: Right     Intervention        Left Ventriculogram    Anteroapical Hypokinesis-EF 45% Culprit lesion LAD-D1: Severe bifurcation LAD-D1 disease: 100% LAD at D1 with 80% proximal D1 -> Successful PTCA-DES PCI of the 80% D1 (Onyx Frontier DES 2.25 mm x 12 mm deployed to 2.4 mm) lesion that did not involve the ostium followed by Successful DES PCI of the LAD (Onyx Frontier DES 2.5 mg x 26 mm deployed to 2.75 mm and postdilated proximally to 3.0 mm)  The ostium of the diagonal branch was not involved. Lesions reduced to 0%. TIMI-3 flow restored. Modest 60% mid RCA stenosis but otherwise no  significant disease. EF likely at least 45 to 50% with possible apical anterior hypokinesis and severely elevated LVEDP. RECOMMENDATIONS: Admit to inpatient ICU.  [facility]   In the absence of any other complications or medical issues, we expect the patient to be ready for discharge from an interventional cardiology perspective on 10/19/2024.   Will convert from diltiazem to carvedilol 3.125 mg twice daily, add atorvastatin 80 mg daily, and continue ARB Due to elevated LVEDP will treat with 40 mg IV Lasix.  Will check 2D Echocardiogram to better assess EF and filling pressures.   Recommend uninterrupted dual antiplatelet therapy with Aspirin 81mg  daily and Clopidogrel 75mg  daily for a minimum of 12 months (ACS-Class I recommendation).   Anticipate continuing Plavix long-term Alm MICAEL Clay, MD, MS Alm Clay, M.D., M.S. Interventional Cardiologist [facility] Pager # [phone]    Cardiac Studies An echocardiogram was done today and was personally reviewed by me.  It showed normal LV systolic function and wall motion with mild mitral regurgitation.  Patient Profile   88 y.o. female with history of hypertension, hyperlipidemia and PVCs who presented with anterolateral STEMI  Assessment & Plan  1.  Anterolateral STEMI: Status post PCI and drug-eluting stent placement to the LAD and first diagonal with excellent results.  Continue aspirin indefinitely and clopidogrel for at least 12 months.  Continue aggressive treatment of risk factors. Given that her ejection fraction is normal and her systolic pressure is on the low side, I do not recommend an ACE inhibitor or ARB.  2.  Nonsustained ventricular tachycardia: This is expected post myocardial infarction.  Continue small dose carvedilol.  Not able to increase the dose due to relatively low blood pressure.  3.  Essential hypertension: Blood pressure is controlled.  4.  Hyperlipidemia: Continue high-dose  atorvastatin.  5.  Acute on chronic kidney disease: Her baseline GFR  is around 37.  Given elevated LVEDP yesterday, she was given 1 dose of IV furosemide.  GFR today is 32.  Avoid nephrotoxic medications.  No need for further diuresis.  Depending on her overall progress, consider discharge home in the afternoon after proper ambulation.     For questions or updates, please contact [facility] Please consult [email or web address] for contact info under       Signed, Deatrice Cage, MD  10/18/2024, 2:40 PM

## 2024-10-18 NOTE — Plan of Care (Signed)

## 2024-10-18 NOTE — TOC Initial Note (Signed)
 Transition of Care [facility]) - Initial/Assessment Note    Patient Details  Name: Rose Wilkins MRN: [id number] Date of Birth: 1936-05-02  Transition of Care [facility]) CM/SW Contact:    Delphine KANDICE Bring, RN Phone Number: 10/18/2024, 1:05 PM  Clinical Narrative:                 Patient currently have oxygen via nasal cannula. No family at bedside. Patient states she lives alone but her daughter Rocky stays with her about two days per week. Patient states that her daughter goes to her MD appointments. Patient states that her PCP is Oneil Pinal. Patient denies services in the home.  Patient states that she walked with the nurses in the hallway. Patient plan to go home when medically ready. Patient states that her daughter will pick her up.  Expected Discharge Plan: Home w Home Health Services Barriers to Discharge: No Barriers Identified   Patient Goals and CMS Choice            Expected Discharge Plan and Services       Living arrangements for the past 2 months: Single Family Home                                      Prior Living Arrangements/Services Living arrangements for the past 2 months: Single Family Home Lives with:: Self   Do you feel safe going back to the place where you live?: Yes          Current home services: DME (rolling walker, cane)    Activities of Daily Living   ADL Screening (condition at time of admission) Independently performs ADLs?: Yes (appropriate for developmental age) Is the patient deaf or have difficulty hearing?: No Does the patient have difficulty seeing, even when wearing glasses/contacts?: No Does the patient have difficulty concentrating, remembering, or making decisions?: No  Permission Sought/Granted                  Emotional Assessment Appearance:: Appears older than stated age, Darel stated age Attitude/Demeanor/Rapport: Engaged Affect (typically observed): Accepting, Appropriate Orientation: : Oriented to Self,  Oriented to Place, Oriented to  Time      Admission diagnosis:  Acute ST elevation myocardial infarction (STEMI) of anterolateral wall (HCC) [I21.09] STEMI (ST elevation myocardial infarction) [facility]) [I21.3] Patient Active Problem List   Diagnosis Date Noted   Acute ST elevation myocardial infarction (STEMI) of anterolateral wall (HCC) 10/17/2024   Coronary artery disease involving native coronary artery of native heart with unstable angina pectoris (HCC) 10/17/2024   Presence of drug coated stent in LAD coronary artery and 1st Diag 10/17/2024   Benign essential hypertension 10/19/2020   Gastro-esophageal reflux disease with esophagitis 10/19/2020   PVC's (premature ventricular contractions) 10/19/2020   Pain due to onychomycosis of toenails of both feet 10/19/2020   Medicare annual wellness visit, initial 08/19/2016   Hyperlipidemia with target low density lipoprotein (LDL) cholesterol less than 55 mg/dL 91/68/7983   PCP:  [place] Oneil FALCON, MD Pharmacy:   [facility] [address] [place] [address] Phone: [phone] Fax: [phone]     Social Drivers of Health (SDOH) Social History: SDOH Screenings   Food Insecurity: No Food Insecurity (10/17/2024)  Housing: Low Risk  (10/17/2024)  Transportation Needs: No Transportation Needs (10/17/2024)  Utilities: At Risk (10/17/2024)  Depression (PHQ2-9): Low Risk  (11/17/2021)  Financial  Resource Strain: Low Risk  (10/18/2023)   Received from [facility]  Social Connections: Socially Isolated (10/17/2024)  Tobacco Use: Low Risk  (10/17/2024)   SDOH Interventions:     Readmission Risk Interventions     No data to display

## 2024-10-18 NOTE — Evaluation (Signed)
 Physical Therapy Evaluation Patient Details Name: Rose Wilkins MRN: [id number] DOB: 1936-07-22 Today's Date: 10/18/2024  History of Present Illness  Rose Wilkins is an 88yoF who [facility] on 10[facility] with acute onset bilat hand pain, chest pain, EMG changes suggestive of STEMI, pt taken for cardiac cath. Pt taken to ICU s/p proedure, has 1 episode of VT and presyncopal bradycardia followng procedure, but has since been able to AMB in hall with NSG. 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 Pt has support from family at baselne for IADL.  Clinical Impression  Pt in recliner on entry, has been up to chair twice today with NSG in ICU, also AMB in hallway. DTR at bedside. Pt able to demonstrate safe transfers and AMB with RW, a little slower than typically pacing, but not limited by any specific symptoms. Pt has baseline imbalance and elevated falls risk from neuropathy, could be more safe by modifying mobility strategies and use of DME at home- this is discussed at length with pt and DTR. Anticipate ability to safely transition to home at DC with some assistance from family. All DME in place already. HHPT would be of benefit to promote cardiac healing in a safe capacity. Will continue to follow while admitted.       If plan is discharge home, recommend the following: A little help with walking and/or transfers;Assist for transportation;Assistance with cooking/housework;Help with stairs or ramp for entrance   Can travel by private vehicle        Equipment Recommendations None recommended by PT  Recommendations for Other Services       Functional Status Assessment Patient has had a recent decline in their functional status and demonstrates the ability to make significant improvements in function in a reasonable and predictable amount of time.     Precautions / Restrictions  Precautions Precautions: Fall Recall of Precautions/Restrictions: Intact Restrictions Weight Bearing Restrictions Per Provider Order: No      Mobility  Bed Mobility                    Transfers Overall transfer level: Needs assistance Equipment used: Rolling walker (2 wheels) Transfers: Sit to/from Stand Sit to Stand: Supervision           General transfer comment: recall cues from earlier in day, safe performance, no LOB.    Ambulation/Gait Ambulation/Gait assistance: Supervision, Contact guard assist Gait Distance (Feet): 240 Feet Assistive device: Rolling walker (2 wheels) Gait Pattern/deviations: WFL(Within Functional Limits) Gait velocity: 0.19m/s (a bit slower than baseline AMB, mostly out of caution)   Pre-gait activities: marching in place at bedside.    Stairs            Wheelchair Mobility     Tilt Bed    Modified Rankin (Stroke Patients Only)       Balance                                             Pertinent Vitals/Pain Pain Assessment Pain Assessment: No/denies pain    Home Living Family/patient expects to be discharged to:: Private residence Living Arrangements: Alone Available Help at Discharge: Family;Available PRN/intermittently Type of Home: House Home Access: Stairs to enter Entrance Stairs-Rails: None Entrance Stairs-Number of Steps: 1+1   Home Layout: Laundry  or work area in basement;Two level;Able to live on main level with bedroom/bathroom Home Equipment: Rolling Walker (2 wheels);Rollator (4 wheels);Cane - single point;Shower seat      Prior Function Prior Level of Function : Independent/Modified Independent;Driving             Mobility Comments: reports multiple near falls and has fallen on steps. no AD use baseline       Extremity/Trunk Assessment   Upper Extremity Assessment Upper Extremity Assessment: Overall WFL for tasks assessed    Lower Extremity Assessment Lower  Extremity Assessment: Generalized weakness       Communication   Communication Communication: No apparent difficulties    Cognition Arousal: Alert Behavior During Therapy: WFL for tasks assessed/performed   PT - Cognitive impairments: No apparent impairments                                 Cueing       General Comments General comments (skin integrity, edema, etc.): SpO2 86% on RA seated after activity    Exercises     Assessment/Plan    PT Assessment Patient needs continued PT services  PT Problem List Decreased activity tolerance;Decreased balance;Decreased mobility;Cardiopulmonary status limiting activity       PT Treatment Interventions Gait training;Stair training;Functional mobility training;Therapeutic activities;Therapeutic exercise;Balance training    PT Goals (Current goals can be found in the Care Plan section)  Acute Rehab PT Goals Patient Stated Goal: return to home asap PT Goal Formulation: With patient Time For Goal Achievement: 11/01/24 Potential to Achieve Goals: Good    Frequency Min 3X/week     Co-evaluation               AM-PAC PT 6 Clicks Mobility  Outcome Measure Help needed turning from your back to your side while in a flat bed without using bedrails?: A Little Help needed moving from lying on your back to sitting on the side of a flat bed without using bedrails?: A Little Help needed moving to and from a bed to a chair (including a wheelchair)?: A Little Help needed standing up from a chair using your arms (e.g., wheelchair or bedside chair)?: A Little Help needed to walk in hospital room?: A Little Help needed climbing 3-5 steps with a railing? : A Little 6 Click Score: 18    End of Session Equipment Utilized During Treatment: Gait belt;Oxygen Activity Tolerance: Patient tolerated treatment well;No increased pain Patient left: in chair;with call bell/phone within reach;with nursing/sitter in room;with  family/visitor present Nurse Communication: Mobility status PT Visit Diagnosis: Difficulty in walking, not elsewhere classified (R26.2);Other abnormalities of gait and mobility (R26.89);History of falling (Z91.81);Muscle weakness (generalized) (M62.81)    Time: 8469-8455 PT Time Calculation (min) (ACUTE ONLY): 14 min   Charges:   PT Evaluation $PT Eval Moderate Complexity: 1 Mod   PT General Charges $$ ACUTE PT VISIT: 1 Visit       4:39 PM, 10/18/24 Peggye JAYSON Linear, PT, DPT Physical Therapist - [facility]  [phone] (ASCOM)    Lummie Montijo C 10/18/2024, 4:37 PM

## 2024-10-18 NOTE — Plan of Care (Signed)
  Problem: Education: Goal: Knowledge of General Education information will improve Description: Including pain rating scale, medication(s)/side effects and non-pharmacologic comfort measures Outcome: Progressing   Problem: Clinical Measurements: Goal: Ability to maintain clinical measurements within normal limits will improve Outcome: Progressing Goal: Diagnostic test results will improve Outcome: Progressing   Problem: Activity: Goal: Risk for activity intolerance will decrease Outcome: Progressing   Problem: Cardiovascular: Goal: Ability to achieve and maintain adequate cardiovascular perfusion will improve Outcome: Progressing Goal: Vascular access site(s) Level 0-1 will be maintained Outcome: Progressing

## 2024-10-18 NOTE — Progress Notes (Signed)
 PHARMACIST - PHYSICIAN COMMUNICATION  CONCERNING:  Enoxaparin (Lovenox) for DVT Prophylaxis    RECOMMENDATION: Patient was prescribed enoxaprin 40mg  q24 hours for VTE prophylaxis.   Filed Weights   10/17/24 1148 10/17/24 1450  Weight: 69.9 kg (154 lb) 77.2 kg (170 lb 3.1 oz)    Body mass index is 26.66 kg/m.  Estimated Creatinine Clearance: 27.9 mL/min (A) (by C-G formula based on SCr of 1.55 mg/dL (H)).   Based on [facility] policy patient is candidate for enoxaparin 30mg  every 24 hours based on CrCl <15ml/min   DESCRIPTION: Pharmacy has adjusted enoxaparin dose per [facility] policy.  Patient is now receiving enoxaparin 30 mg every 24 hours    Adriana JONETTA Bolster, PharmD Clinical Pharmacist  10/18/2024 3:10 PM

## 2024-10-18 NOTE — Progress Notes (Addendum)
 1000 ambulated 50 feet in hall on room air patient did well hr stayed the same 80s sp02 91-92% used walker   1025 10 beat vtac while in bed, SP02 drops to 86 on room air requiring 2L [place] patient doesn't use oxygen at home 1650 patient transported to 234 via wheel chair alert x4 able to make all needs known daughter at bedside aware of transfer

## 2024-10-19 DIAGNOSIS — I2109 ST elevation (STEMI) myocardial infarction involving other coronary artery of anterior wall: Secondary | ICD-10-CM | POA: Diagnosis not present

## 2024-10-19 LAB — BASIC METABOLIC PANEL WITH GFR
Anion gap: 11 (ref 5–15)
BUN: 35 mg/dL — ABNORMAL HIGH (ref 8–23)
CO2: 22 mmol/L (ref 22–32)
Calcium: 8.6 mg/dL — ABNORMAL LOW (ref 8.9–10.3)
Chloride: 100 mmol/L (ref 98–111)
Creatinine, Ser: 1.63 mg/dL — ABNORMAL HIGH (ref 0.44–1.00)
GFR, Estimated: 31 mL/min — ABNORMAL LOW (ref >60)
Glucose, Bld: 111 mg/dL — ABNORMAL HIGH (ref 70–99)
Potassium: 4.3 mmol/L (ref 3.5–5.1)
Sodium: 133 mmol/L — ABNORMAL LOW (ref 135–145)

## 2024-10-19 MED ORDER — OXYCODONE HCL 5 MG PO TABS: 5.0000 mg | ORAL_TABLET | Freq: Four times a day (QID) | ORAL | Status: DC | PRN

## 2024-10-19 MED ORDER — SODIUM CHLORIDE 0.9 % IV SOLN: INTRAVENOUS | Status: AC

## 2024-10-19 MED ORDER — HEPARIN SODIUM (PORCINE) 5000 UNIT/ML IJ SOLN
5000.0000 [IU] | Freq: Three times a day (TID) | INTRAMUSCULAR | Status: DC
  Administered 2024-10-19 – 2024-10-20 (×4): 5000 [IU] via SUBCUTANEOUS
  Filled 2024-10-19 (×4): qty 1

## 2024-10-19 NOTE — Progress Notes (Signed)
 PROGRESS NOTE    Rose Wilkins   FMW:[id number] DOB: 12-08-1936  DOA: 10/17/2024 Date of Service: 10/19/24 which is hospital day 2  PCP: Cleotilde Oneil FALCON, MD    Hospital course / significant events:   Rose Wilkins is a 88 y.o. female with Past medical history of HTN, HLD, PVCs, peripheral neuropathy, GERD, Depression, as reviewed from EMR, presented at [facility] ED with complaining of pain in the hand and she had chest pain in the triage.  Patient was found to have a STEMI on EKG, cardiology was consulted, patient was taken to the cardiac Cath Lab, s/p PCI, 2 stents were placed. Patient was transferred to ICU after cardiac cath and admitted to hospitalist service.     Consultants:  Cardiology   Procedures/Surgeries: 10/17/24       ASSESSMENT & PLAN:   Anterolateral STEMI S/p PCI to LAD  Hyperlipidemia / atherosclerosis  ASA 81 mg/Plavix 75 mg daily as DAPT x 1 year, at which point would stop aspirin and continue Plavix atorvastatin 80 mg  diltiazem converted to carvedilol 3.125 mg twice daily ARB holding for now d/t renal function 2D echocardiogram pending   AKI on CKD stage 3 a/b Likely d/t contrast w/ cath and lasix received post-cath  Scr 1.2 on admission 10/30 --> 1.55 10/31 --> 1.63 11/01 Monitor urine output and renal functions daily IV fluids 1L today  Avoid nephrotoxic medications, use as needed medications  Essential HTN  diltiazem converted to carvedilol 3.125 mg twice daily ARB holding for now d/t renal fxn   Hyponatremia - resolved Monitor sodium level / BMP   Lower back pain - suspect disc disease +/- subacute compression fracture  Compression fracture undetermined chronicity: XR (+)Mild compression fracture at T11 and through the superior endplate of L1, age indeterminate - clinically significant imaging finding  Describes bending last month and felt pop, now w/ difficulty walking, no numbness or focal weakness Of note pt was taking motrin eery 4  hours or so over few days preceding her presentation to hospital w/ STEMI PT/OT Outpatient follow up  Pain control  Peripheral neuropathy Lyrica home dose  Depression Cymbalta  GERD  PPI     overweight based on BMI: Body mass index is 26.66 kg/m.[name] Significantly low or high BMI is associated with higher medical risk.  Underweight - under 18  overweight - 25 to 29 obese - 30 or more Class 1 obesity: BMI of 30.0 to 34 Class 2 obesity: BMI of 35.0 to 39 Class 3 obesity: BMI of 40.0 to 49 Super Morbid Obesity: BMI 50-59 Super-super Morbid Obesity: BMI 60+ Healthy nutrition and physical activity advised as adjunct to other disease management and risk reduction treatments    DVT prophylaxis: ambulation IV fluids: no continuous IV fluids  Nutrition: cardiac Central lines / other devices: none  Code Status: FULL CODE ACP documentation reviewed:  none on file in [facility]  TOC needs: TBD based on PT eval  Medical barriers to dispo: AKI, PT eval. Expected medical readiness for discharge tomorrow.              Subjective / Brief ROS:  Patient reports no concerns today  Denies CP/SOB.  Pain controlled.  Denies new weakness.  Tolerating diet.  Reports no concerns w/ urination/defecation.   Family Communication: none at this time    Objective Findings:  Vitals:   10/18/24 2330 10/19/24 0438 10/19/24 0820 10/19/24 1132  BP: 126/60 (!) 140/75 129/67 (!) 106/53  Pulse: 77 77  75 75  Resp: 18 18 20 18   Temp: 98.1 F (36.7 C) 98.4 F (36.9 C) 98.9 F (37.2 C) 99.1 F (37.3 C)  TempSrc:      SpO2: 94% 94% 93% 93%  Weight:      Height:        Intake/Output Summary (Last 24 hours) at 10/19/2024 1339 Last data filed at 10/19/2024 0442 Gross per 24 hour  Intake 460 ml  Output 800 ml  Net -340 ml   Filed Weights   10/17/24 1148 10/17/24 1450  Weight: 69.9 kg 77.2 kg    Examination:  Physical Exam Constitutional:      General: She is not in acute  distress.    Appearance: She is not ill-appearing.  Cardiovascular:     Rate and Rhythm: Normal rate and regular rhythm.  Pulmonary:     Effort: Pulmonary effort is normal.     Breath sounds: Normal breath sounds.  Abdominal:     General: Bowel sounds are normal.     Palpations: Abdomen is soft.  Musculoskeletal:     Right lower leg: No edema.     Left lower leg: No edema.  Skin:    General: Skin is warm and dry.  Neurological:     Mental Status: She is alert and oriented to person, place, and time.          Scheduled Medications:   aspirin  81 mg Oral Daily   atorvastatin  80 mg Oral Daily   carvedilol  3.125 mg Oral BID WC   Chlorhexidine Gluconate Cloth  6 each Topical Daily   clopidogrel  75 mg Oral Q breakfast   DULoxetine  30 mg Oral q morning   heparin injection (subcutaneous)  5,000 Units Subcutaneous Q8H   pantoprazole  40 mg Oral QHS   pregabalin  25 mg Oral BID   sodium chloride  flush  3 mL Intravenous Q12H    Continuous Infusions:  sodium chloride  100 mL/hr at 10/19/24 0838    PRN Medications:  acetaminophen, morphine injection, ondansetron (ZOFRAN) IV, oxyCODONE, sodium chloride  flush  Antimicrobials from admission:  Anti-infectives (From admission, onward)    None           Data Reviewed:  I have personally reviewed the following...  CBC: Recent Labs  Lab 10/17/24 1213 10/17/24 1540 10/18/24 0558  WBC 5.7 10.4 8.9  NEUTROABS  --  8.4*  --   HGB 12.4 12.4 11.0*  HCT 37.1 37.8 31.9*  MCV 97.6 97.7 96.1  PLT 211 229 221   Basic Metabolic Panel: Recent Labs  Lab 10/17/24 1213 10/17/24 1540 10/18/24 0558 10/19/24 0633  NA 134* 132* 135 133*  K 4.5 4.8 4.1 4.3  CL 101 102 103 100  CO2 23 20* 21* 22  GLUCOSE 172* 129* 108* 111*  BUN 31* 29* 35* 35*  CREATININE 1.32* 1.20* 1.55* 1.63*  CALCIUM 8.8* 8.8* 8.4* 8.6*  MG  --   --  2.1  --   PHOS  --   --  4.1  --    GFR: Estimated Creatinine Clearance: 26.5 mL/min (A) (by  C-G formula based on SCr of 1.63 mg/dL (H)). Liver Function Tests: Recent Labs  Lab 10/17/24 1540  AST 41  ALT 16  ALKPHOS 67  BILITOT 0.7  PROT 7.5  ALBUMIN 3.7   No results for input(s): LIPASE, AMYLASE in the last 168 hours. No results for input(s): AMMONIA in the last 168 hours. Coagulation Profile: Recent Labs  Lab 10/17/24 1230  INR 1.0   Cardiac Enzymes: No results for input(s): CKTOTAL, CKMB, CKMBINDEX, TROPONINI in the last 168 hours. BNP (last 3 results) No results for input(s): PROBNP in the last 8760 hours. HbA1C: No results for input(s): HGBA1C in the last 72 hours. CBG: Recent Labs  Lab 10/17/24 1442  GLUCAP 108*   Lipid Profile: Recent Labs    10/17/24 1540  CHOL 231*  HDL 49  LDLCALC 171*  TRIG 56  CHOLHDL 4.7   Thyroid Function Tests: No results for input(s): TSH, T4TOTAL, FREET4, T3FREE, THYROIDAB in the last 72 hours. Anemia Panel: No results for input(s): VITAMINB12, FOLATE, FERRITIN, TIBC, IRON, RETICCTPCT in the last 72 hours. Most Recent Urinalysis On File:  No results found for: COLORURINE, APPEARANCEUR, LABSPEC, PHURINE, GLUCOSEU, HGBUR, BILIRUBINUR, KETONESUR, PROTEINUR, UROBILINOGEN, NITRITE, LEUKOCYTESUR Sepsis Labs: @LABRCNTIP (procalcitonin:4,lacticidven:4) Microbiology: Recent Results (from the past 240 hours)  MRSA Next Gen by PCR, Nasal     Status: None   Collection Time: 10/17/24  2:54 PM   Specimen: Nasal Mucosa; Nasal Swab  Result Value Ref Range Status   MRSA by PCR Next Gen NOT DETECTED NOT DETECTED Final    Comment: (NOTE) The GeneXpert MRSA Assay (FDA approved for NASAL specimens only), is one component of a comprehensive MRSA colonization surveillance program. It is not intended to diagnose MRSA infection nor to guide or monitor treatment for MRSA infections. Test performance is not FDA approved in patients less than 29 years old. Performed at [facility], [address] [place], [place] [address]       Radiology Studies last 3 days: DG Lumbar Spine 2-3 Views Result Date: 10/18/2024 EXAM: 2 or 3 VIEW(S) XRAY OF THE LUMBAR SPINE 10/18/2024 02:10:00 PM COMPARISON: None available. CLINICAL HISTORY: Lower back pain. FINDINGS: LUMBAR SPINE: BONES: Mild compression fracture noted at T11 and through the superior endplate of L1, age indeterminate. No aggressive appearing osseous lesion. Slight anterolisthesis of L4 on L5. DISCS AND DEGENERATIVE CHANGES: Diffuse degenerative facet disease. Degenerative disc disease at L5-S1 with disc space narrowing. SOFT TISSUES: Aortic atherosclerosis. IMPRESSION: 1. Mild compression fracture at T11 and through the superior endplate of L1, age indeterminate. 2. Aortic atherosclerosis. Electronically signed by: Franky Crease MD 10/18/2024 07:33 PM EDT RP Workstation: [id number]   ECHOCARDIOGRAM COMPLETE Result Date: 10/18/2024    ECHOCARDIOGRAM REPORT   Patient Name:   Rose Wilkins Date of Exam: 10/18/2024 Medical Rec #:  [id number]        Height:       67.0 in Accession #:    [id number]       Weight:       170.2 lb Date of Birth:  12/27/1935         BSA:          1.888 m Patient Age:    88 years         BP:           137/57 mmHg Patient Gender: F                HR:           88 bpm. Exam Location:  [facility] Procedure: 2D Echo, Cardiac Doppler and Color Doppler (Both Spectral and Color            Flow Doppler were utilized during procedure). Indications:     Acute myocardial infarction, unspecified I21.9  History:         Patient has no prior history of  Echocardiogram examinations.                  Risk Factors:Dyslipidemia and Hypertension.  Sonographer:     Christopher Furnace Referring Phys:  [id number] FLYJFFJI A ARIDA Diagnosing Phys: Redell Cave MD  Sonographer Comments: Suboptimal apical window. IMPRESSIONS  1. Left ventricular ejection fraction, by estimation, is 60 to 65%. The left ventricle has normal function. The  left ventricle has no regional wall motion abnormalities. There is mild left ventricular hypertrophy. Left ventricular diastolic parameters are consistent with Grade I diastolic dysfunction (impaired relaxation).  2. Right ventricular systolic function is normal. The right ventricular size is normal.  3. The mitral valve is degenerative. Mild mitral valve regurgitation.  4. The aortic valve is tricuspid. Aortic valve regurgitation is not visualized. FINDINGS  Left Ventricle: Left ventricular ejection fraction, by estimation, is 60 to 65%. The left ventricle has normal function. The left ventricle has no regional wall motion abnormalities. The left ventricular internal cavity size was normal in size. There is  mild left ventricular hypertrophy. Left ventricular diastolic parameters are consistent with Grade I diastolic dysfunction (impaired relaxation). Right Ventricle: The right ventricular size is normal. No increase in right ventricular wall thickness. Right ventricular systolic function is normal. Left Atrium: Left atrial size was normal in size. Right Atrium: Right atrial size was normal in size. Pericardium: There is no evidence of pericardial effusion. Mitral Valve: The mitral valve is degenerative in appearance. Mild to moderate mitral annular calcification. Mild mitral valve regurgitation. MV peak gradient, 10.8 mmHg. The mean mitral valve gradient is 4.0 mmHg. Tricuspid Valve: The tricuspid valve is normal in structure. Tricuspid valve regurgitation is not demonstrated. Aortic Valve: The aortic valve is tricuspid. Aortic valve regurgitation is not visualized. Aortic valve mean gradient measures 5.0 mmHg. Aortic valve peak gradient measures 8.9 mmHg. Aortic valve area, by VTI measures 5.49 cm. Pulmonic Valve: The pulmonic valve was not well visualized. Pulmonic valve regurgitation is not visualized. Aorta: The aortic root is normal in size and structure. Venous: The inferior vena cava was not well  visualized. IAS/Shunts: No atrial level shunt detected by color flow Doppler.  LEFT VENTRICLE PLAX 2D LVIDd:         3.90 cm   Diastology LVIDs:         2.30 cm   LV e' medial:    6.85 cm/s LV PW:         1.60 cm   LV E/e' medial:  14.5 LV IVS:        1.40 cm   LV e' lateral:   4.46 cm/s LVOT diam:     2.20 cm   LV E/e' lateral: 22.3 LV SV:         115 LV SV Index:   61 LVOT Area:     3.80 cm  RIGHT VENTRICLE RV S prime:     12.20 cm/s TAPSE (M-mode): 1.5 cm LEFT ATRIUM           Index LA diam:      3.20 cm 1.69 cm/m LA Vol (A4C): 36.6 ml 19.38 ml/m  AORTIC VALVE AV Area (Vmax):    3.70 cm AV Area (Vmean):   4.63 cm AV Area (VTI):     5.49 cm AV Vmax:           149.00 cm/s AV Vmean:          101.000 cm/s AV VTI:  0.209 m AV Peak Grad:      8.9 mmHg AV Mean Grad:      5.0 mmHg LVOT Vmax:         145.00 cm/s LVOT Vmean:        123.000 cm/s LVOT VTI:          0.302 m LVOT/AV VTI ratio: 1.44  AORTA Ao Root diam: 2.90 cm MITRAL VALVE                TRICUSPID VALVE MV Area (PHT): 2.41 cm     TR Peak grad:   16.8 mmHg MV Area VTI:   2.61 cm     TR Vmax:        205.00 cm/s MV Peak grad:  10.8 mmHg MV Mean grad:  4.0 mmHg     SHUNTS MV Vmax:       1.64 m/s     Systemic VTI:  0.30 m MV Vmean:      95.3 cm/s    Systemic Diam: 2.20 cm MV Decel Time: 315 msec MV E velocity: 99.60 cm/s MV A velocity: 158.00 cm/s MV E/A ratio:  0.63 Redell Cave MD Electronically signed by Redell Cave MD Signature Date/Time: 10/18/2024/12:58:58 PM    Final    CARDIAC CATHETERIZATION Result Date: 10/17/2024 Table formatting from the original result was not included. Images from the original result were not included.   CULPRIT LESION #1: Prox LAD to Mid LAD lesion is 90% stenosed with 50% stenosed side branch in 1st Diag. Mid LAD lesion is 100% stenosed, after 1st Diag; TIMI 0 flow in LAD   Angioplasty was performed in the diagonal sidebranch prestent => Post intervention, the side branch was reduced to 20% residual  stenosis.   A drug-eluting stent was successfully placed crossing the diagonal sidebranch, using a STENT ONYX FRONTIER 2.5X26 in the main branch.  Stent was deployed to 2.75 mm  & POT proximally to 3.0 mm. Post intervention, there is a 0% residual stenosis.  TIMI-3 flow restored   LESION #2: 1st Diag lesion is 80% stenosed.   A drug-eluting stent was successfully placed using a STENT ONYX FRONTIER 2.25X12, deployed to 2.4 mm..Post intervention, there is a 0% residual stenosis.  TIMI-3 flow maintained   ------------------------------------   Mid RCA lesion is 60% stenosed.   -----------------------------------   There is mild left ventricular systolic dysfunction. The left ventricular ejection fraction is 45-50% by visual estimate.   LV end diastolic pressure is severely elevated. There is no aortic valve stenosis. Diagnostic Dominance: Right     Intervention        Left Ventriculogram    Anteroapical Hypokinesis-EF 45% Culprit lesion LAD-D1: Severe bifurcation LAD-D1 disease: 100% LAD at D1 with 80% proximal D1 -> Successful PTCA-DES PCI of the 80% D1 (Onyx Frontier DES 2.25 mm x 12 mm deployed to 2.4 mm) lesion that did not involve the ostium followed by Successful DES PCI of the LAD (Onyx Frontier DES 2.5 mg x 26 mm deployed to 2.75 mm and postdilated proximally to 3.0 mm)  The ostium of the diagonal branch was not involved. Lesions reduced to 0%. TIMI-3 flow restored. Modest 60% mid RCA stenosis but otherwise no significant disease. EF likely at least 45 to 50% with possible apical anterior hypokinesis and severely elevated LVEDP. RECOMMENDATIONS: Admit to inpatient ICU.  [facility]   In the absence of any other complications or medical issues, we expect the patient to be ready for discharge from  an interventional cardiology perspective on 10/19/2024.   Will convert from diltiazem to carvedilol 3.125 mg twice daily, add atorvastatin 80 mg daily, and continue ARB Due to elevated LVEDP will  treat with 40 mg IV Lasix.  Will check 2D Echocardiogram to better assess EF and filling pressures.   Recommend uninterrupted dual antiplatelet therapy with Aspirin 81mg  daily and Clopidogrel 75mg  daily for a minimum of 12 months (ACS-Class I recommendation).   Anticipate continuing Plavix long-term Alm MICAEL Clay, MD, MS Alm Clay, M.D., M.S. Interventional Cardiologist [facility] Pager # [phone]         Laneta Blunt, DO [facility] 10/19/2024, 1:39 PM    Dictation software may have been used to generate the above note. Typos may occur and escape review in typed/dictated notes. Please contact Dr Blunt directly for clarity if needed.  Staff may message me via secure chat in Epic  but this may not receive an immediate response,  please page me for urgent matters!  If 7PM-7AM, please contact night coverage [email or web address]

## 2024-10-19 NOTE — Progress Notes (Signed)
 Mobility Specialist - Progress Note  Pre-mobility: HR-77,  SpO2-95%  During mobility: HR-101,  SpO2-93%  Post-mobility: HR-111, , SPO2-94%   10/19/24 1641  Mobility  Activity Ambulated with assistance;Stood at bedside;Respositioned in chair  Level of Assistance Contact guard assist, steadying assist  Assistive Device Front wheel walker (IV Stand)  Distance Ambulated (ft) 820 ft (5 strong laps around NS)  Range of Motion/Exercises All extremities  Activity Response Tolerated well  Mobility visit 1 Mobility  Mobility Specialist Start Time (ACUTE ONLY) 1447  Mobility Specialist Stop Time (ACUTE ONLY) 1525  Mobility Specialist Time Calculation (min) (ACUTE ONLY) 38 min   Pt was supine in bed on RA and receiving IV fluids with guest in the room upon entry. Pt agreed to mobility. O2 vitals were taken throughout activity as a precaution. Pt is able to get to the EOB with minA CGA. Pt is able to STS independent with 2 WW. Pt ambulated well throughout activity. Pt did take recovery breaks as a precaution. Pt O2 vitals remained above 88% throughout activity. Pt remained balance throughout activity. After activity pt returned to the room repositioned in recliner with needs in reach and family in the room.  Clem Rodes Mobility Specialist 10/19/24, 5:02 PM

## 2024-10-19 NOTE — Plan of Care (Signed)

## 2024-10-20 ENCOUNTER — Other Ambulatory Visit: Payer: Self-pay

## 2024-10-20 DIAGNOSIS — I2109 ST elevation (STEMI) myocardial infarction involving other coronary artery of anterior wall: Secondary | ICD-10-CM | POA: Diagnosis not present

## 2024-10-20 LAB — BASIC METABOLIC PANEL WITH GFR
Anion gap: 9 (ref 5–15)
BUN: 36 mg/dL — ABNORMAL HIGH (ref 8–23)
CO2: 21 mmol/L — ABNORMAL LOW (ref 22–32)
Calcium: 8.3 mg/dL — ABNORMAL LOW (ref 8.9–10.3)
Chloride: 104 mmol/L (ref 98–111)
Creatinine, Ser: 1.54 mg/dL — ABNORMAL HIGH (ref 0.44–1.00)
GFR, Estimated: 33 mL/min — ABNORMAL LOW (ref >60)
Glucose, Bld: 101 mg/dL — ABNORMAL HIGH (ref 70–99)
Potassium: 4.2 mmol/L (ref 3.5–5.1)
Sodium: 134 mmol/L — ABNORMAL LOW (ref 135–145)

## 2024-10-20 MED ORDER — ATORVASTATIN CALCIUM 80 MG PO TABS
80.0000 mg | ORAL_TABLET | Freq: Every day | ORAL | 0 refills | Status: AC
  Filled 2024-10-20: qty 30, 30d supply, fill #0

## 2024-10-20 MED ORDER — ASPIRIN 81 MG PO CHEW
81.0000 mg | CHEWABLE_TABLET | Freq: Every day | ORAL | 0 refills | Status: DC
  Filled 2024-10-20: qty 30, 30d supply, fill #0

## 2024-10-20 MED ORDER — OXYCODONE HCL 5 MG PO TABS
5.0000 mg | ORAL_TABLET | Freq: Four times a day (QID) | ORAL | 0 refills | Status: AC | PRN
  Filled 2024-10-20: qty 10, 3d supply, fill #0

## 2024-10-20 MED ORDER — CLOPIDOGREL BISULFATE 75 MG PO TABS
75.0000 mg | ORAL_TABLET | Freq: Every day | ORAL | 0 refills | Status: DC
  Filled 2024-10-20: qty 90, 90d supply, fill #0

## 2024-10-20 MED ORDER — CARVEDILOL 3.125 MG PO TABS
3.1250 mg | ORAL_TABLET | Freq: Two times a day (BID) | ORAL | 0 refills | Status: DC
  Filled 2024-10-20: qty 60, 30d supply, fill #0

## 2024-10-20 NOTE — Discharge Summary (Signed)
 Physician Discharge Summary   Patient: Rose Wilkins MRN: [id number]  DOB: 1936-08-17   Admit:     Date of Admission: 10/17/2024 Admitted from: home   Discharge: Date of discharge: 10/20/24 Disposition: Home health Condition at discharge: good  CODE STATUS: FULL CODE     Discharge Physician: Laneta Blunt, DO [facility]     PCP: Cleotilde Oneil FALCON, MD  Recommendations for Outpatient Follow-up:  Follow up with PCP Cleotilde Oneil FALCON, MD in 1-2 weeks - monitor AKI/CKD and may need ortho/spine follow up  Follow as directed w/ cardiology and cardiac rehab     Discharge Instructions     AMB Referral to Cardiac Rehabilitation - Phase II   Complete by: As directed    Diagnosis:  STEMI Coronary Stents     After initial evaluation and assessments completed: Virtual Based Care may be provided alone or in conjunction with Phase 2 Cardiac Rehab based on patient barriers.: Yes   Intensive Cardiac Rehabilitation (ICR) [facility] location only OR Traditional Cardiac Rehabilitation (TCR) *If criteria for ICR are not met will enroll in TCR [facility] only): Yes   Diet - low sodium heart healthy   Complete by: As directed    Diet - low sodium heart healthy   Complete by: As directed    Increase activity slowly   Complete by: As directed    Increase activity slowly   Complete by: As directed          Discharge Diagnoses: Principal Problem:   Acute ST elevation myocardial infarction (STEMI) of anterolateral wall (HCC) Active Problems:   Coronary artery disease involving native coronary artery of native heart with unstable angina pectoris (HCC)   Presence of drug coated stent in LAD coronary artery and 1st Diag   Benign essential hypertension   Hyperlipidemia with target low density lipoprotein (LDL) cholesterol less than 55 mg/dL       Hospital Course: Hospital course / significant events:   Rose Wilkins is a 88 y.o. female with Past medical history of HTN, HLD,  PVCs, peripheral neuropathy, GERD, Depression, as reviewed from EMR, presented at [facility] ED with complaining of pain in the hand and she had chest pain in the triage.  Patient was found to have a STEMI on EKG, cardiology was consulted, patient was taken to the cardiac Cath Lab, s/p PCI, 2 stents were placed. Patient was transferred to ICU after cardiac cath and admitted to hospitalist service. AKI likely d/t lasix / cath, improving though not quite back to baseline, added RN to home health to ensure blood draw in the next 3-4 days for close monitoring.     Consultants:  Cardiology   Procedures/Surgeries: 10/17/24 cardiac cath       ASSESSMENT & PLAN:   Anterolateral STEMI S/p PCI to LAD  Hyperlipidemia / atherosclerosis  ASA 81 mg/Plavix 75 mg daily as DAPT x 1 year, at which point would stop aspirin and continue Plavix atorvastatin 80 mg   carvedilol 3.125 mg twice daily ARB holding for now d/t renal function Cardiology follow up and cardiac rehab   AKI on CKD stage 3 a/b Likely d/t contrast w/ cath and lasix received post-cath  Scr 1.2 on admission 10/30 --> 1.55 10/31 --> 1.63 11/01 --> 1.5 11/02 Monitor renal function outpatient  Avoid nephrotoxic medications Stop NSAID at home   Essential HTN  diltiazem converted to carvedilol 3.125 mg twice daily ARB holding for now d/t renal fxn   Hyponatremia -  resolved Monitor sodium level / BMP   Lower back pain - suspect disc disease +/- subacute compression fracture  Compression fracture undetermined chronicity: XR (+)Mild compression fracture at T11 and through the superior endplate of L1, age indeterminate - clinically significant imaging finding  Describes bending last month and felt pop, now w/ difficulty walking, no numbness or focal weakness Of note pt was taking motrin eery 4 hours or so over few days preceding her presentation to hospital w/ STEMI PT/OT Outpatient follow up  Pain control - avoid NSAID< pt has been  educated on this esp w/ AKI and STEMI  Peripheral neuropathy Lyrica home dose  Depression Cymbalta  GERD  PPI     overweight based on BMI: Body mass index is 26.66 kg/m.[name] Significantly low or high BMI is associated with higher medical risk.  Underweight - under 18  overweight - 25 to 29 obese - 30 or more Class 1 obesity: BMI of 30.0 to 34 Class 2 obesity: BMI of 35.0 to 39 Class 3 obesity: BMI of 40.0 to 49 Super Morbid Obesity: BMI 50-59 Super-super Morbid Obesity: BMI 60+ Healthy nutrition and physical activity advised as adjunct to other disease management and risk reduction treatments    DVT prophylaxis: ambulation IV fluids: no continuous IV fluids  Nutrition: cardiac Central lines / other devices: none  Code Status: FULL CODE ACP documentation reviewed:  none on file in [facility]  TOC needs: TBD based on PT eval  Medical barriers to dispo: AKI, PT eval. Expected medical readiness for discharge tomorrow.             Discharge Instructions  Allergies as of 10/20/2024       Reactions   Gabapentin Rash   Sulfa Antibiotics Rash   Onset 07/31/1999.        Medication List     STOP taking these medications    clonazePAM 0.5 MG tablet Commonly known as: KLONOPIN   diltiazem 120 MG 24 hr capsule Commonly known as: CARDIZEM CD   telmisartan 40 MG tablet Commonly known as: MICARDIS   traZODone 50 MG tablet Commonly known as: DESYREL       TAKE these medications    acetaminophen 325 MG tablet Commonly known as: TYLENOL Take 325 mg by mouth in the morning and at bedtime.   aspirin 81 MG chewable tablet Chew 1 tablet (81 mg total) by mouth daily. Start taking on: October 21, 2024   atorvastatin 80 MG tablet Commonly known as: LIPITOR Take 1 tablet (80 mg total) by mouth daily. Start taking on: October 21, 2024   carvedilol 3.125 MG tablet Commonly known as: COREG Take 1 tablet (3.125 mg total) by mouth 2 (two) times daily with a  meal.   clopidogrel 75 MG tablet Commonly known as: PLAVIX Take 1 tablet (75 mg total) by mouth daily with breakfast. Start taking on: October 21, 2024   cyanocobalamin 1000 MCG tablet Take 1,000 mcg by mouth as needed.   DULoxetine 30 MG capsule Commonly known as: CYMBALTA Take 30 mg by mouth every morning.   omeprazole 20 MG capsule Commonly known as: PRILOSEC Take 20 mg by mouth at bedtime.   oxyCODONE 5 MG immediate release tablet Commonly known as: Oxy IR/ROXICODONE Take 1 tablet (5 mg total) by mouth every 6 (six) hours as needed for severe pain (pain score 7-10).   pregabalin 25 MG capsule Commonly known as: LYRICA Take 25 mg by mouth in the morning and at bedtime.  Follow-up Information     Darron Deatrice LABOR, MD Follow up.   Specialty: Cardiology Why: office will call with follow up but plase call them if you have not heard back by early next week Contact information: [address] [place] [address] [phone]         Cleotilde Oneil FALCON, MD. Schedule an appointment as soon as possible for a visit.   Specialty: Internal Medicine Why: hospital follow-up STEMI, pt also having chronic back pain suspect disc disease as well as compression fracture undetermined chroniticy, may need ortho referral / further imaging Contact information: [address] [facility] [place] [address] [phone]                 Allergies  Allergen Reactions   Gabapentin Rash   Sulfa Antibiotics Rash    Onset 07/31/1999.     Subjective: pt reports feeling well today, ambulating a bit better, tolerating diet, no CP/SOB   Discharge Exam: BP (!) 153/67 (BP Location: Left Arm)   Pulse 68   Temp 97.8 F (36.6 C) (Oral)   Resp 18   Ht 5' 7 (1.702 m)   Wt 77.2 kg   SpO2 93%   BMI 26.66 kg/m  General: Pt is alert, awake, not in acute distress Cardiovascular: RRR, S1/S2 +, no rubs, no  gallops Respiratory: CTA bilaterally, no wheezing, no rhonchi Abdominal: Soft, NT, ND, bowel sounds + Extremities: no edema, no cyanosis     The results of significant diagnostics from this hospitalization (including imaging, microbiology, ancillary and laboratory) are listed below for reference.     Microbiology: Recent Results (from the past 240 hours)  MRSA Next Gen by PCR, Nasal     Status: None   Collection Time: 10/17/24  2:54 PM   Specimen: Nasal Mucosa; Nasal Swab  Result Value Ref Range Status   MRSA by PCR Next Gen NOT DETECTED NOT DETECTED Final    Comment: (NOTE) The GeneXpert MRSA Assay (FDA approved for NASAL specimens only), is one component of a comprehensive MRSA colonization surveillance program. It is not intended to diagnose MRSA infection nor to guide or monitor treatment for MRSA infections. Test performance is not FDA approved in patients less than 66 years old. Performed at [facility], [address] [place], [place] [address]      Labs: BNP (last 3 results) No results for input(s): BNP in the last 8760 hours. Basic Metabolic Panel: Recent Labs  Lab 10/17/24 1213 10/17/24 1540 10/18/24 0558 10/19/24 0633 10/20/24 0553  NA 134* 132* 135 133* 134*  K 4.5 4.8 4.1 4.3 4.2  CL 101 102 103 100 104  CO2 23 20* 21* 22 21*  GLUCOSE 172* 129* 108* 111* 101*  BUN 31* 29* 35* 35* 36*  CREATININE 1.32* 1.20* 1.55* 1.63* 1.54*  CALCIUM 8.8* 8.8* 8.4* 8.6* 8.3*  MG  --   --  2.1  --   --   PHOS  --   --  4.1  --   --    Liver Function Tests: Recent Labs  Lab 10/17/24 1540  AST 41  ALT 16  ALKPHOS 67  BILITOT 0.7  PROT 7.5  ALBUMIN 3.7   No results for input(s): LIPASE, AMYLASE in the last 168 hours. No results for input(s): AMMONIA in the last 168 hours. CBC: Recent Labs  Lab 10/17/24 1213 10/17/24 1540 10/18/24 0558  WBC 5.7 10.4 8.9  NEUTROABS  --  8.4*  --  HGB 12.4 12.4 11.0*  HCT 37.1 37.8 31.9*  MCV 97.6  97.7 96.1  PLT 211 229 221   Cardiac Enzymes: No results for input(s): CKTOTAL, CKMB, CKMBINDEX, TROPONINI in the last 168 hours. BNP: Invalid input(s): POCBNP CBG: Recent Labs  Lab 10/17/24 1442  GLUCAP 108*   D-Dimer No results for input(s): DDIMER in the last 72 hours. Hgb A1c No results for input(s): HGBA1C in the last 72 hours. Lipid Profile Recent Labs    10/17/24 1540  CHOL 231*  HDL 49  LDLCALC 171*  TRIG 56  CHOLHDL 4.7   Thyroid function studies No results for input(s): TSH, T4TOTAL, T3FREE, THYROIDAB in the last 72 hours.  Invalid input(s): FREET3 Anemia work up No results for input(s): VITAMINB12, FOLATE, FERRITIN, TIBC, IRON, RETICCTPCT in the last 72 hours. Urinalysis No results found for: COLORURINE, APPEARANCEUR, LABSPEC, PHURINE, GLUCOSEU, HGBUR, BILIRUBINUR, KETONESUR, PROTEINUR, UROBILINOGEN, NITRITE, LEUKOCYTESUR Sepsis Labs Recent Labs  Lab 10/17/24 1213 10/17/24 1540 10/18/24 0558  WBC 5.7 10.4 8.9   Microbiology Recent Results (from the past 240 hours)  MRSA Next Gen by PCR, Nasal     Status: None   Collection Time: 10/17/24  2:54 PM   Specimen: Nasal Mucosa; Nasal Swab  Result Value Ref Range Status   MRSA by PCR Next Gen NOT DETECTED NOT DETECTED Final    Comment: (NOTE) The GeneXpert MRSA Assay (FDA approved for NASAL specimens only), is one component of a comprehensive MRSA colonization surveillance program. It is not intended to diagnose MRSA infection nor to guide or monitor treatment for MRSA infections. Test performance is not FDA approved in patients less than 43 years old. Performed at [facility], [address] [place], [place] [address]    Imaging DG Lumbar Spine 2-3 Views Result Date: 10/18/2024 EXAM: 2 or 3 VIEW(S) XRAY OF THE LUMBAR SPINE 10/18/2024 02:10:00 PM COMPARISON: None available. CLINICAL HISTORY: Lower back pain. FINDINGS: LUMBAR  SPINE: BONES: Mild compression fracture noted at T11 and through the superior endplate of L1, age indeterminate. No aggressive appearing osseous lesion. Slight anterolisthesis of L4 on L5. DISCS AND DEGENERATIVE CHANGES: Diffuse degenerative facet disease. Degenerative disc disease at L5-S1 with disc space narrowing. SOFT TISSUES: Aortic atherosclerosis. IMPRESSION: 1. Mild compression fracture at T11 and through the superior endplate of L1, age indeterminate. 2. Aortic atherosclerosis. Electronically signed by: Franky Crease MD 10/18/2024 07:33 PM EDT RP Workstation: [id number]   ECHOCARDIOGRAM COMPLETE Result Date: 10/18/2024    ECHOCARDIOGRAM REPORT   Patient Name:   Rose Wilkins Date of Exam: 10/18/2024 Medical Rec #:  [id number]        Height:       67.0 in Accession #:    [id number]       Weight:       170.2 lb Date of Birth:  05-25-1936         BSA:          1.888 m Patient Age:    88 years         BP:           137/57 mmHg Patient Gender: F                HR:           88 bpm. Exam Location:  [facility] Procedure: 2D Echo, Cardiac Doppler and Color Doppler (Both Spectral and Color            Flow Doppler were utilized during procedure). Indications:  Acute myocardial infarction, unspecified I21.9  History:         Patient has no prior history of Echocardiogram examinations.                  Risk Factors:Dyslipidemia and Hypertension.  Sonographer:     Christopher Furnace Referring Phys:  [id number] FLYJFFJI A ARIDA Diagnosing Phys: Redell Cave MD  Sonographer Comments: Suboptimal apical window. IMPRESSIONS  1. Left ventricular ejection fraction, by estimation, is 60 to 65%. The left ventricle has normal function. The left ventricle has no regional wall motion abnormalities. There is mild left ventricular hypertrophy. Left ventricular diastolic parameters are consistent with Grade I diastolic dysfunction (impaired relaxation).  2. Right ventricular systolic function is normal. The right ventricular size is normal.   3. The mitral valve is degenerative. Mild mitral valve regurgitation.  4. The aortic valve is tricuspid. Aortic valve regurgitation is not visualized. FINDINGS  Left Ventricle: Left ventricular ejection fraction, by estimation, is 60 to 65%. The left ventricle has normal function. The left ventricle has no regional wall motion abnormalities. The left ventricular internal cavity size was normal in size. There is  mild left ventricular hypertrophy. Left ventricular diastolic parameters are consistent with Grade I diastolic dysfunction (impaired relaxation). Right Ventricle: The right ventricular size is normal. No increase in right ventricular wall thickness. Right ventricular systolic function is normal. Left Atrium: Left atrial size was normal in size. Right Atrium: Right atrial size was normal in size. Pericardium: There is no evidence of pericardial effusion. Mitral Valve: The mitral valve is degenerative in appearance. Mild to moderate mitral annular calcification. Mild mitral valve regurgitation. MV peak gradient, 10.8 mmHg. The mean mitral valve gradient is 4.0 mmHg. Tricuspid Valve: The tricuspid valve is normal in structure. Tricuspid valve regurgitation is not demonstrated. Aortic Valve: The aortic valve is tricuspid. Aortic valve regurgitation is not visualized. Aortic valve mean gradient measures 5.0 mmHg. Aortic valve peak gradient measures 8.9 mmHg. Aortic valve area, by VTI measures 5.49 cm. Pulmonic Valve: The pulmonic valve was not well visualized. Pulmonic valve regurgitation is not visualized. Aorta: The aortic root is normal in size and structure. Venous: The inferior vena cava was not well visualized. IAS/Shunts: No atrial level shunt detected by color flow Doppler.  LEFT VENTRICLE PLAX 2D LVIDd:         3.90 cm   Diastology LVIDs:         2.30 cm   LV e' medial:    6.85 cm/s LV PW:         1.60 cm   LV E/e' medial:  14.5 LV IVS:        1.40 cm   LV e' lateral:   4.46 cm/s LVOT diam:     2.20 cm    LV E/e' lateral: 22.3 LV SV:         115 LV SV Index:   61 LVOT Area:     3.80 cm  RIGHT VENTRICLE RV S prime:     12.20 cm/s TAPSE (M-mode): 1.5 cm LEFT ATRIUM           Index LA diam:      3.20 cm 1.69 cm/m LA Vol (A4C): 36.6 ml 19.38 ml/m  AORTIC VALVE AV Area (Vmax):    3.70 cm AV Area (Vmean):   4.63 cm AV Area (VTI):     5.49 cm AV Vmax:           149.00 cm/s AV Vmean:  101.000 cm/s AV VTI:            0.209 m AV Peak Grad:      8.9 mmHg AV Mean Grad:      5.0 mmHg LVOT Vmax:         145.00 cm/s LVOT Vmean:        123.000 cm/s LVOT VTI:          0.302 m LVOT/AV VTI ratio: 1.44  AORTA Ao Root diam: 2.90 cm MITRAL VALVE                TRICUSPID VALVE MV Area (PHT): 2.41 cm     TR Peak grad:   16.8 mmHg MV Area VTI:   2.61 cm     TR Vmax:        205.00 cm/s MV Peak grad:  10.8 mmHg MV Mean grad:  4.0 mmHg     SHUNTS MV Vmax:       1.64 m/s     Systemic VTI:  0.30 m MV Vmean:      95.3 cm/s    Systemic Diam: 2.20 cm MV Decel Time: 315 msec MV E velocity: 99.60 cm/s MV A velocity: 158.00 cm/s MV E/A ratio:  0.63 Redell Cave MD Electronically signed by Redell Cave MD Signature Date/Time: 10/18/2024/12:58:58 PM    Final       Time coordinating discharge: over 30 minutes  SIGNED:  Rondall Radigan DO [facility]

## 2024-10-20 NOTE — Plan of Care (Signed)

## 2024-10-20 NOTE — TOC Progression Note (Signed)
 Transition of Care [facility]) - Initial/Assessment Note    Patient Details  Name: Rose Wilkins MRN: [id number] Date of Birth: 07/11/1936  Transition of Care [facility]) CM/SW Contact:    Iyannah Blake L Zahraa Bhargava, LCSW Phone Number: 10/20/2024, 11:15 AM  Clinical Narrative:                   Home Health orders are in. [facility] will provide PT/OT and RN to patient in the home. Daughter will provide discharge transportation. Meds to bed has been ordered. Daughter was contacted and advised of HHA.   No further TOC needs identified. TOC signing off.   Expected Discharge Plan: Home w Home Health Services Barriers to Discharge: No Barriers Identified   Patient Goals and CMS Choice            Expected Discharge Plan and Services       Living arrangements for the past 2 months: Single Family Home Expected Discharge Date: 10/20/24                                    Prior Living Arrangements/Services Living arrangements for the past 2 months: Single Family Home Lives with:: Self   Do you feel safe going back to the place where you live?: Yes          Current home services: DME (rolling walker, cane)    Activities of Daily Living   ADL Screening (condition at time of admission) Independently performs ADLs?: Yes (appropriate for developmental age) Is the patient deaf or have difficulty hearing?: No Does the patient have difficulty seeing, even when wearing glasses/contacts?: No Does the patient have difficulty concentrating, remembering, or making decisions?: No  Permission Sought/Granted                  Emotional Assessment Appearance:: Appears older than stated age, Darel stated age Attitude/Demeanor/Rapport: Engaged Affect (typically observed): Accepting, Appropriate Orientation: : Oriented to Self, Oriented to Place, Oriented to  Time      Admission diagnosis:  Acute ST elevation myocardial infarction (STEMI) of anterolateral wall (HCC) [I21.09] STEMI (ST  elevation myocardial infarction) [facility]) [I21.3] Patient Active Problem List   Diagnosis Date Noted   Acute ST elevation myocardial infarction (STEMI) of anterolateral wall (HCC) 10/17/2024   Coronary artery disease involving native coronary artery of native heart with unstable angina pectoris (HCC) 10/17/2024   Presence of drug coated stent in LAD coronary artery and 1st Diag 10/17/2024   Benign essential hypertension 10/19/2020   Gastro-esophageal reflux disease with esophagitis 10/19/2020   PVC's (premature ventricular contractions) 10/19/2020   Pain due to onychomycosis of toenails of both feet 10/19/2020   Medicare annual wellness visit, initial 08/19/2016   Hyperlipidemia with target low density lipoprotein (LDL) cholesterol less than 55 mg/dL 91/68/7983   PCP:  Cleotilde Oneil FALCON, MD Pharmacy:   [facility] [address] [place] [address] Phone: [phone] Fax: [phone]     Social Drivers of Health (SDOH) Social History: SDOH Screenings   Food Insecurity: No Food Insecurity (10/17/2024)  Housing: Low Risk  (10/17/2024)  Transportation Needs: No Transportation Needs (10/17/2024)  Utilities: At Risk (10/17/2024)  Depression (PHQ2-9): Low Risk  (11/17/2021)  Financial Resource Strain: Low Risk  (10/18/2023)   Received from [facility]  Social Connections: Socially Isolated (10/17/2024)  Tobacco Use: Low Risk  (10/17/2024)   SDOH Interventions:  Readmission Risk Interventions     No data to display

## 2024-10-20 NOTE — Discharge Instructions (Signed)
 Home Health has been set up with [facility]. You will receive a phone call 24 to 48 hours after discharge.

## 2024-10-21 ENCOUNTER — Encounter: Payer: Self-pay | Admitting: Cardiology

## 2024-10-21 LAB — LIPOPROTEIN A (LPA): Lipoprotein (a): 66.6 nmol/L — ABNORMAL HIGH (ref <75.0)

## 2024-10-25 ENCOUNTER — Encounter: Payer: Self-pay | Admitting: Cardiology

## 2024-10-25 ENCOUNTER — Ambulatory Visit: Attending: Cardiology | Admitting: Cardiology

## 2024-10-25 VITALS — BP 116/70 | HR 82 | Ht 67.5 in | Wt 169.0 lb

## 2024-10-25 DIAGNOSIS — I10 Essential (primary) hypertension: Secondary | ICD-10-CM | POA: Diagnosis not present

## 2024-10-25 DIAGNOSIS — Z79899 Other long term (current) drug therapy: Secondary | ICD-10-CM

## 2024-10-25 DIAGNOSIS — I452 Bifascicular block: Secondary | ICD-10-CM

## 2024-10-25 DIAGNOSIS — E785 Hyperlipidemia, unspecified: Secondary | ICD-10-CM | POA: Diagnosis not present

## 2024-10-25 DIAGNOSIS — N1832 Chronic kidney disease, stage 3b: Secondary | ICD-10-CM

## 2024-10-25 DIAGNOSIS — I4729 Other ventricular tachycardia: Secondary | ICD-10-CM

## 2024-10-25 DIAGNOSIS — I2510 Atherosclerotic heart disease of native coronary artery without angina pectoris: Secondary | ICD-10-CM

## 2024-10-25 NOTE — Patient Instructions (Signed)
 Medication Instructions:   Your physician recommends that you continue on your current medications as directed. Please refer to the Current Medication list given to you today.    *If you need a refill on your cardiac medications before your next appointment, please call your pharmacy*  Lab Work:  Your provider would like for you to have following labs drawn today BMet, CBC.    If you have labs (blood work) drawn today and your tests are completely normal, you will receive your results only by:  MyChart Message (if you have MyChart) OR  A paper copy in the mail If you have any lab test that is abnormal or we need to change your treatment, we will call you to review the results.  Testing/Procedures:  None ordered at this time   Referrals:  None ordered at this time   Follow-Up:  At [facility], you and your health needs are our priority.  As part of our continuing mission to provide you with exceptional heart care, our providers are all part of one team.  This team includes your primary Cardiologist (physician) and Advanced Practice Providers or APPs (Physician Assistants and Nurse Practitioners) who all work together to provide you with the care you need, when you need it.  Your next appointment:   6 week(s)  Provider:    Tylene Lunch, NP    We recommend signing up for the patient portal called MyChart.  Sign up information is provided on this After Visit Summary.  MyChart is used to connect with patients for Virtual Visits (Telemedicine).  Patients are able to view lab/test results, encounter notes, upcoming appointments, etc.  Non-urgent messages can be sent to your provider as well.   To learn more about what you can do with MyChart, go to [email or web address].

## 2024-10-25 NOTE — Progress Notes (Signed)
 Cardiology Office Note   Date:  10/25/2024  ID:  Rose Wilkins, DOB Oct 18, 1936, MRN [id number] PCP: Cleotilde Oneil FALCON, MD  [facility] Providers Cardiologist:  Alm Clay, MD     History of Present Illness Rose Wilkins is a 88 y.o. female with a past medical history of hypertension, hyperlipidemia, PVCs, peripheral neuropathy, gastroesophageal reflux disease, depression, coronary artery disease status post STEMI (10/17/2024), who is here today for hospital follow-up.   Patient presented to the [facility] emergency department with bilateral hand pain that started this morning while holding a bowl of cereal.  She had a history of neuropathy but states the feeling was different.  She was allowed to walk to the restroom started complaining of pain under her left breast stating that she thinks that she was having a heart attack.  Patient was taken into the triage room for an EKG and blood work at that time.  Second EKG completed and revealed a STEMI.  Code STEMI was then activated.Initial vital signs revealed blood pressure 132/70, pulse of 54, respirations of 18, temperature 98.3.  Pertinent labs have a sodium of 134, blood glucose 172, BUN 31, serum creatinine 1.32, calcium 8.8, high-sensitivity troponin 53.  EKG had new ST elevations in lead III and decision was made to repeat a repeat EKG within 10 minutes.  Repeat EKG was clearly consistent with STEMI and the Cath Lab was activated.  Patient was treated with 324 mg of aspirin and a bolus of heparin.  She was taken emergently to the Cath Lab.  Cath revealed culprit lesion of 1 more proximal LAD to mid LAD with lesion with 90% stenosis 50% stenosed sidebranch of the first diagonal, mid LAD lesion was 100% stenosed up to the first diagonal, angioplasty was performed in the diagonal sidebranch, drug-eluting stent was successfully placed across the diagonal sidebranch.  Lesion #2 was the first diagonal lesion with 80% stenosis successfully treated  with a DES.  Mid RCA lesion was 60% stenosed.  There was mid left ventricular systolic dysfunction in the left ventricle ejection fraction 45-50% by visual estimate LV Endosol impression was severely elevated but there was no aortic valve stenosis.  Recommendation was to convert diltiazem to carvedilol 3.25 mg twice daily, add atorvastatin 80 mg daily continue ARB but due to elevated LVEDP and treat with 40 mg of IV Lasix.  2D echocardiogram revealed an LVEF of 60 to 65%, no RWMA, G1 DD, and mild MR.  Recommended uninterrupted dual antiplatelet therapy of aspirin 81 mg daily and clopidogrel 75 mg daily for minimum of 12 months and anticipate continuing clopidogrel long-term.  After catheterization she was transferred to the ICU and admitted to the hospitalist service.  She had AKI likely due to Lasix and That improved and not quite back to baseline.  Her ARB was being held due to renal function.  She was hyponatremic originally which had resolved and the recommendation was to continue to monitor her sodium levels.  She was recommended for cardiac rehab and was to be discharged home with home health.   She returns to clinic today accompanied by family member.  Overall she states that she has been doing fairly well.  She continues to suffer from fatigue.  She denies any chest pain, shortness of breath, dyspnea on exertion or peripheral edema.  She states that she has been compliant with her current medication regimen without any undue side effects.  She states that she does note on occasion when she is moving  around fast that she will catch herself breathing hard but she is not short of breath.  She was post to have home health and declined at their visit when they called this week.  She states that she was discharged with oxycodone for pain but she does not like the way it makes her feel so she has been taking Tylenol previously she was taking Tylenol in the a.m. and p.m. and is now taking 1 in the afternoon but  that has controlled her pain.  She has upcoming follow-up with her primary care provider.  ROS: 10 point review of systems has been reviewed and considered negative with the exception was been listed in the HPI  Studies Reviewed EKG Interpretation Date/Time:  Friday October 25 2024 14:40:57 EST Ventricular Rate:  71 PR Interval:  166 QRS Duration:  114 QT Interval:  408 QTC Calculation: 443 R Axis:   -54  Text Interpretation: Normal sinus rhythm Right bundle branch block Left anterior fascicular block Bifascicular block When compared with ECG of 18-Oct-2024 05:48, No significant change since last tracing Confirmed by Gerard Frederick ([id number]) on 10/25/2024 2:43:00 PM    2d echo 10/18/2024 1. Left ventricular ejection fraction, by estimation, is 60 to 65%. The  left ventricle has normal function. The left ventricle has no regional  wall motion abnormalities. There is mild left ventricular hypertrophy.  Left ventricular diastolic parameters  are consistent with Grade I diastolic dysfunction (impaired relaxation).   2. Right ventricular systolic function is normal. The right ventricular  size is normal.   3. The mitral valve is degenerative. Mild mitral valve regurgitation.   4. The aortic valve is tricuspid. Aortic valve regurgitation is not  visualized.   LHC 10/17/2024   CULPRIT LESION #1: Prox LAD to Mid LAD lesion is 90% stenosed with 50% stenosed side branch in 1st Diag. Mid LAD lesion is 100% stenosed, after 1st Diag; TIMI 0 flow in LAD   Angioplasty was performed in the diagonal sidebranch prestent => Post intervention, the side branch was reduced to 20% residual stenosis.   A drug-eluting stent was successfully placed crossing the diagonal sidebranch, using a STENT ONYX FRONTIER 2.5X26 in the main branch.  Stent was deployed to 2.75 mm  & POT proximally to 3.0 mm. Post intervention, there is a 0% residual stenosis.  TIMI-3 flow restored   LESION #2: 1st Diag lesion is 80%  stenosed.   A drug-eluting stent was successfully placed using a STENT ONYX FRONTIER 2.25X12, deployed to 2.4 mm..Post intervention, there is a 0% residual stenosis.  TIMI-3 flow maintained   ------------------------------------   Mid RCA lesion is 60% stenosed.   -----------------------------------   There is mild left ventricular systolic dysfunction. The left ventricular ejection fraction is 45-50% by visual estimate.   LV end diastolic pressure is severely elevated. There is no aortic valve stenosis.   Diagnostic    Dominance: Right                                                  Intervention  Left Ventriculogram                                               Anteroapical Hypokinesis-EF 45%    Culprit lesion LAD-D1: Severe bifurcation LAD-D1 disease: 100% LAD at D1 with 80% proximal D1 ->  Successful PTCA-DES PCI of the 80% D1 (Onyx Frontier DES 2.25 mm x 12 mm deployed to 2.4 mm) lesion that did not involve the ostium followed by  Successful DES PCI of the LAD (Onyx Frontier DES 2.5 mg x 26 mm deployed to 2.75 mm and postdilated proximally to 3.0 mm)  The ostium of the diagonal branch was not involved. Lesions reduced to 0%. TIMI-3 flow restored. Modest 60% mid RCA stenosis but otherwise no significant disease. EF likely at least 45 to 50% with possible apical anterior hypokinesis and severely elevated LVEDP.    RECOMMENDATIONS: Admit to inpatient ICU.  [facility]   In the absence of any other complications or medical issues, we expect the patient to be ready for discharge from an interventional cardiology perspective on 10/19/2024.   Will convert from diltiazem to carvedilol 3.125 mg twice daily, add atorvastatin 80 mg daily, and continue ARB Due to elevated LVEDP will treat with 40 mg IV Lasix.  Will check 2D Echocardiogram to better assess EF and filling pressures.   Recommend uninterrupted  dual antiplatelet therapy with Aspirin 81mg  daily and Clopidogrel 75mg  daily for a minimum of 12 months (ACS-Class I recommendation).   Anticipate continuing Plavix long-term     Risk Assessment/Calculations           Physical Exam VS:  BP 116/70 (BP Location: Left Arm, Patient Position: Sitting, Cuff Size: Normal)   Pulse 82   Ht 5' 7.5 (1.715 m)   Wt 169 lb (76.7 kg)   SpO2 97%   BMI 26.08 kg/m        Wt Readings from Last 3 Encounters:  10/25/24 169 lb (76.7 kg)  10/17/24 170 lb 3.1 oz (77.2 kg)  06/07/22 160 lb (72.6 kg)    GEN: Well nourished, well developed in no acute distress NECK: No JVD; No carotid bruits CARDIAC: RRR, no murmurs, rubs, gallops RESPIRATORY:  Clear to auscultation without rales, wheezing or rhonchi  ABDOMEN: Soft, non-tender, non-distended EXTREMITIES:  No edema; No deformity; various stages of bruising noted to her arms  ASSESSMENT AND PLAN Anterior lateral STEMI (10/17/2024) status post PCI and DES placement to the LAD and first diagonal.  She is continued on aspirin indefinitely and clopidogrel for minimum of 12 months.  She is also continued on atorvastatin 80 mg daily.  EKG today reveals sinus rhythm with a rate of 71 with a right bundle branch block, left intrafascicular block, with no acute ischemic changes.  She is being sent for follow-up CBC and BMP today after recent hospitalization for hyponatremia and AKI.  Discussed cardiac rehab as a referral was made during her recent hospitalization and she was given the benefits at minimum if she does not think she will be able to attempt for exercise as she may benefit from her initial consultation to discuss the program prior to deciding against it.  Nonsustained ventricular tachycardia noted on telemetry monitoring during her hospitalization where she was recommended to continue on small dose of carvedilol she is continued on 3.125 mg twice daily.  Primary hypertension with a  blood pressure today  116/70.  Blood pressure has been well-controlled she thinks it is too low even though she is asymptomatic.  She has been continued on carvedilol 3.125 mg twice daily.  She is also been encouraged to monitor pressure 1 to 2 hours postmedication administration at home as well.  Hyperlipidemia with a last LDL of 171 with a goal of 70.  She states all of her life she has had LDL.  She has been continued on atorvastatin 80 mg daily.  She will need updated lipid panel in approximately 6 weeks.  She may need additional medications added on after repeat labs are completed.  CKD IIIb with baseline GFR of around 36.  She has been sent for an updated BMP today to reassess kidney function.  She has been encouraged to maintain adequate hydration.  And to continue to avoid NSAIDs we went over each individual NSAID.  Bifascicular block noted on EKG with a sinus rhythm 71, right bundle branch block left anterior fascicular block.  Patient is asymptomatic.  Will continue to monitor with surveillance studies.   Cardiac Rehabilitation Eligibility Assessment         Dispo: Patient to return to clinic see MD/APP in 6 weeks or sooner if needed for further evaluation.  Signed, Geral Tuch, NP

## 2024-10-26 LAB — BASIC METABOLIC PANEL WITH GFR
BUN/Creatinine Ratio: 21 (ref 12–28)
BUN: 31 mg/dL — ABNORMAL HIGH (ref 8–27)
CO2: 19 mmol/L — ABNORMAL LOW (ref 20–29)
Calcium: 9.1 mg/dL (ref 8.7–10.3)
Chloride: 99 mmol/L (ref 96–106)
Creatinine, Ser: 1.46 mg/dL — ABNORMAL HIGH (ref 0.57–1.00)
Glucose: 104 mg/dL — ABNORMAL HIGH (ref 70–99)
Potassium: 5.7 mmol/L — ABNORMAL HIGH (ref 3.5–5.2)
Sodium: 134 mmol/L (ref 134–144)
eGFR: 35 mL/min/1.73 — ABNORMAL LOW (ref >59)

## 2024-10-26 LAB — CBC
Hematocrit: 32.3 % — ABNORMAL LOW (ref 34.0–46.6)
Hemoglobin: 10.7 g/dL — ABNORMAL LOW (ref 11.1–15.9)
MCH: 32.7 pg (ref 26.6–33.0)
MCHC: 33.1 g/dL (ref 31.5–35.7)
MCV: 99 fL — ABNORMAL HIGH (ref 79–97)
Platelets: 346 x10E3/uL (ref 150–450)
RBC: 3.27 x10E6/uL — ABNORMAL LOW (ref 3.77–5.28)
RDW: 12.9 % (ref 11.7–15.4)
WBC: 7.3 x10E3/uL (ref 3.4–10.8)

## 2024-10-28 ENCOUNTER — Other Ambulatory Visit: Payer: Self-pay

## 2024-10-28 ENCOUNTER — Ambulatory Visit: Payer: Self-pay | Admitting: Cardiology

## 2024-10-28 ENCOUNTER — Telehealth: Payer: Self-pay | Admitting: Cardiology

## 2024-10-28 DIAGNOSIS — Z79899 Other long term (current) drug therapy: Secondary | ICD-10-CM

## 2024-10-28 MED ORDER — LOKELMA 10 G PO PACK
10.0000 g | PACK | ORAL | 0 refills | Status: AC | PRN
Start: 2024-10-28 — End: ?

## 2024-10-28 MED ORDER — LOKELMA 10 G PO PACK: 10.0000 g | PACK | Freq: Once | ORAL | 0 refills | Status: DC

## 2024-10-28 NOTE — Telephone Encounter (Signed)
 Pt c/o medication issue:  1. Name of Medication: Lokelma 10 g   2. How are you currently taking this medication (dosage and times per day)?   3. Are you having a reaction (difficulty breathing--STAT)? No  4. What is your medication issue? Pharmacy called stating this is something they can't supply to the pt because they only get boxes of 30. Please advise

## 2024-10-28 NOTE — Telephone Encounter (Signed)
 Do we have a dose she can come buy and pick up?

## 2024-10-28 NOTE — Telephone Encounter (Signed)
 Can you change the order so she can get the box we will take 1 dose of 10 g now and the other will be PRN?

## 2024-10-28 NOTE — Telephone Encounter (Signed)
 Spoke with patient regarding Lokelma. Stated we changed the order to be the box of 30. She should use one bag of lokelma and mix with water per instructions. The other doses will be used as needed based on K+ lab work results. Patient instructed not to take those unless instructed by a doctor.

## 2024-10-28 NOTE — Progress Notes (Signed)
 Hemoglobin has remained stable.  Potassium is elevated at 5.7 with improving kidney function.  Hold all potassium supplements.  Give 1 dose of Lokelma 10 g with a repeat BMP Wednesday of this week.

## 2024-11-21 NOTE — Progress Notes (Signed)
 Patient Profile:   Rose Wilkins  is a 88 y.o.  female Chief Complaint  Patient presents with   Follow-up      PROBLEM LIST: Past Medical History:  Diagnosis Date   Anemia, unspecified    DDD (degenerative disc disease), cervical    Essential hypertension, benign    Exostosis of unspecified site    Iron deficiency anemia, unspecified    Other and unspecified hyperlipidemia    Postmenopausal state    PVC's (premature ventricular contractions)    Reflux esophagitis     Past Surgical History:  Procedure Laterality Date   ENDOSCOPIC CARPAL TUNNEL RELEASE Right 09/09/2020   Dr. Kathlynn    ALLERGIES: Allergies  Allergen Reactions   Gabapentin Rash   Zoloft [Sertraline] Diarrhea   Sulfa (Sulfonamide Antibiotics) Rash    Onset 07/31/1999.    CURRENT MEDICATIONS: Current Outpatient Medications  Medication Sig Dispense Refill   acetaminophen  (TYLENOL ) 500 MG tablet Take 500 mg by mouth 2 (two) times daily     aspirin  81 MG chewable tablet Take 81 mg by mouth once daily     carvediloL  (COREG ) 3.125 MG tablet Take 3.125 mg by mouth 2 (two) times daily with meals     clopidogreL  (PLAVIX ) 75 mg tablet Take 75 mg by mouth daily with breakfast     CRANBERRY ORAL Take by mouth One daily     cyanocobalamin (VITAMIN B12) 1000 MCG tablet Take 1,000 mcg by mouth     DULoxetine  (CYMBALTA ) 30 MG DR capsule Take 1 capsule (30 mg total) by mouth once daily 90 capsule 3   LOKELMA  10 gram packet Take 10 g by mouth     MAGNESIUM ORAL Take 1 tablet by mouth once daily NOT EVERY DAY.     omeprazole (PRILOSEC) 20 MG DR capsule Take 20 mg by mouth at bedtime     pregabalin  (LYRICA ) 25 MG capsule TAKE 1 CAPSULE BY MOUTH TWO TIMES DAILY 60 capsule 5   telmisartan (MICARDIS) 40 MG tablet Take 1 tablet (40 mg total) by mouth once daily 30 tablet 11   atorvastatin  (LIPITOR ) 20 MG tablet Take 1 tablet (20 mg total) by mouth once daily 90  tablet 3   No current facility-administered medications for this visit.      HPI   CLINICAL SUMMARY:  Patient post MI, doing well, asymptomatic, hemoglobin up to 12.  Uninterested in cardiac rehab.  No further chest discomfort  ROS: Review of systems is unremarkable for any active cardiac, respiratory, GI, GU, hematologic, neurologic, dermatologic, HEENT, or psychiatric symptoms except as noted above, 10 systems reviewed.  No fevers, chills, or constitutional symptoms.   PHYSICAL EXAM  Vital signs:  BP 132/78   Pulse 75   Ht 167.6 cm (5' 6)   Wt 75.1 kg (165 lb 9.6 oz)   LMP  (LMP Unknown)   SpO2 98%   BMI 26.73 kg/m  Body mass index is 26.73 kg/m.   Wt Readings from Last 3 Encounters:  11/21/24 75.1 kg (165 lb 9.6 oz)  10/30/24 76.6 kg (168 lb 12.8 oz)  10/18/23 75.4 kg (166 lb 3.2 oz)     BP Readings from Last 3 Encounters:  11/21/24 132/78  10/30/24 122/70  10/18/23 (!) 140/80    Constitutional:NAD Neck: supple, no thyromegaly, good ROM Respiratory:clear to auscultation, no rales or wheezes  Cardiovascular:RRR, no murmur or gallop Abdominal:soft, good BS, NT Ext: no edema, good peripheral pulses Neuro: alert and oriented X 3, grossly nonfocal     ASSESSMENT/PLAN   Post MI-doing well declines cardiac rehab, physically active.  Cannot swallow the 80 mg Lipitor  pill, dropped down to 20 mg Maintain Plavix  Anemia-has been on Vitron-C, hemoglobin at 12, will finish December with the iron pill and then discontinue Hypertension-controlled  Dispo:   Return in about 6 months (around 05/22/2025) for followup.

## 2024-11-28 ENCOUNTER — Ambulatory Visit: Attending: Cardiology | Admitting: Cardiology

## 2024-11-28 ENCOUNTER — Encounter: Payer: Self-pay | Admitting: Cardiology

## 2024-11-28 VITALS — BP 118/60 | HR 74 | Ht 67.0 in | Wt 165.0 lb

## 2024-11-28 DIAGNOSIS — E785 Hyperlipidemia, unspecified: Secondary | ICD-10-CM

## 2024-11-28 DIAGNOSIS — I10 Essential (primary) hypertension: Secondary | ICD-10-CM

## 2024-11-28 DIAGNOSIS — I4729 Other ventricular tachycardia: Secondary | ICD-10-CM

## 2024-11-28 DIAGNOSIS — Z79899 Other long term (current) drug therapy: Secondary | ICD-10-CM

## 2024-11-28 DIAGNOSIS — I452 Bifascicular block: Secondary | ICD-10-CM | POA: Diagnosis not present

## 2024-11-28 DIAGNOSIS — Z8639 Personal history of other endocrine, nutritional and metabolic disease: Secondary | ICD-10-CM | POA: Diagnosis not present

## 2024-11-28 DIAGNOSIS — I2510 Atherosclerotic heart disease of native coronary artery without angina pectoris: Secondary | ICD-10-CM | POA: Diagnosis not present

## 2024-11-28 DIAGNOSIS — N1832 Chronic kidney disease, stage 3b: Secondary | ICD-10-CM | POA: Diagnosis not present

## 2024-11-28 MED ORDER — ASPIRIN 81 MG PO CHEW: 81.0000 mg | CHEWABLE_TABLET | Freq: Every day | ORAL | 3 refills | Status: AC

## 2024-11-28 MED ORDER — CARVEDILOL 3.125 MG PO TABS: 3.1250 mg | ORAL_TABLET | Freq: Two times a day (BID) | ORAL | 3 refills | Status: AC

## 2024-11-28 MED ORDER — CLOPIDOGREL BISULFATE 75 MG PO TABS: 75.0000 mg | ORAL_TABLET | Freq: Every day | ORAL | 3 refills | Status: AC

## 2024-11-28 NOTE — Patient Instructions (Signed)
 Medication Instructions:  Your physician recommends that you continue on your current medications as directed. Please refer to the Current Medication list given to you today.   *If you need a refill on your cardiac medications before your next appointment, please call your pharmacy*  Lab Work: Lipid, Hepatic panel If you have labs (blood work) drawn today and your tests are completely normal, you will receive your results only by: MyChart Message (if you have MyChart) OR A paper copy in the mail If you have any lab test that is abnormal or we need to change your treatment, we will call you to review the results.  Testing/Procedures: No test ordered today   Follow-Up: At [facility], you and your health needs are our priority.  As part of our continuing mission to provide you with exceptional heart care, our providers are all part of one team.  This team includes your primary Cardiologist (physician) and Advanced Practice Providers or APPs (Physician Assistants and Nurse Practitioners) who all work together to provide you with the care you need, when you need it.  Your next appointment:   12 month(s)  Provider:   You may see Alm Clay, MD or one of the following Advanced Practice Providers on your designated Care Team:   Tylene Lunch, NP   We recommend signing up for the patient portal called MyChart.  Sign up information is provided on this After Visit Summary.  MyChart is used to connect with patients for Virtual Visits (Telemedicine).  Patients are able to view lab/test results, encounter notes, upcoming appointments, etc.  Non-urgent messages can be sent to your provider as well.   To learn more about what you can do with MyChart, go to [email or web address].

## 2024-11-28 NOTE — Progress Notes (Signed)
 Cardiology Office Note   Date:  11/28/2024  ID:  CAYLYN TEDESCHI, DOB [birth year], MRN [id number] PCP: Cleotilde Oneil FALCON, MD  [facility] Providers Cardiologist:  Alm Clay, MD     History of Present Illness Rose Wilkins is a 88 y.o. female with a past medical history significant for hypertension, hyperlipidemia, gastroesophageal reflux disease, depression, coronary artery disease status post STEMI (10/07/2024), who is here today for follow-up.   Patient presented to the [facility] emergency department with bilateral hand pain that started this morning while holding a bowl of cereal.  She had a history of neuropathy but states the feeling was different.  She was allowed to walk to the restroom started complaining of pain under her left breast stating that she thinks that she was having a heart attack.  Patient was taken into the triage room for an EKG and blood work at that time.  Second EKG completed and revealed a STEMI.  Code STEMI was then activated.Initial vital signs revealed blood pressure 132/70, pulse of 54, respirations of 18, temperature 98.3.  Pertinent labs have a sodium of 134, blood glucose 172, BUN 31, serum creatinine 1.32, calcium  8.8, high-sensitivity troponin 53.  EKG had new ST elevations in lead III and decision was made to repeat a repeat EKG within 10 minutes.  Repeat EKG was clearly consistent with STEMI and the Cath Lab was activated.  Patient was treated with 324 mg of aspirin  and a bolus of heparin .  She was taken emergently to the Cath Lab.  Cath revealed culprit lesion of 1 more proximal LAD to mid LAD with lesion with 90% stenosis 50% stenosed sidebranch of the first diagonal, mid LAD lesion was 100% stenosed up to the first diagonal, angioplasty was performed in the diagonal sidebranch, drug-eluting stent was successfully placed across the diagonal sidebranch.  Lesion #2 was the first diagonal lesion with 80% stenosis successfully treated with a DES.  Mid RCA lesion  was 60% stenosed.  There was mid left ventricular systolic dysfunction in the left ventricle ejection fraction 45-50% by visual estimate LV Endosol impression was severely elevated but there was no aortic valve stenosis.  Recommendation was to convert diltiazem to carvedilol  3.25 mg twice daily, add atorvastatin  80 mg daily continue ARB but due to elevated LVEDP and treat with 40 mg of IV Lasix .  2D echocardiogram revealed an LVEF of 60 to 65%, no RWMA, G1 DD, and mild MR.  Recommended uninterrupted dual antiplatelet therapy of aspirin  81 mg daily and clopidogrel  75 mg daily for minimum of 12 months and anticipate continuing clopidogrel  long-term.  After catheterization she was transferred to the ICU and admitted to the hospitalist service.  She had AKI likely due to Lasix  and That improved and not quite back to baseline.  Her ARB was being held due to renal function.  She was hyponatremic originally which had resolved and the recommendation was to continue to monitor her sodium levels.  She was recommended for cardiac rehab and was to be discharged home with home health.  She was last seen in clinic 10/25/2024 accompanied by her family.  Overall she was doing fairly well.  She continues to suffer from fatigue but denies any chest pain or shortness of breath.  She was found mild on discharge from the hospital but declined the visit.  She had been compliant with her current medication regimen without any undue side effects.    She returns to clinic today accompanied by family member.  She  states overall she has been doing well.  She denies any chest pain, shortness of breath, dyspnea on exertion or palpitations.  Unfortunately she has run out of the majority of her medication and is continuing to take her last dose of Plavix  and aspirin .  She stated that she recently followed up with her primary care provider and was advised that her cholesterol medicine was too high and he was reducing the dose but unfortunately  a refill as she had to be sent in.  She states that she has not taken her blood so for another month but her hemoglobin was improving.  She states that her PCP said she no longer needed to see cardiology that she could continue to follow with her PCP for all of her needs.  She continues to decline cardiac rehab.  She denies any recent hospitalizations or visits to the emergency department.  ROS: 10 point review of system has been reviewed and considered negative except was been listed in the HPI  Studies Reviewed EKG Interpretation Date/Time:  Thursday November 28 2024 11:05:06 EST Ventricular Rate:  74 PR Interval:  168 QRS Duration:  106 QT Interval:  396 QTC Calculation: 439 R Axis:   -59  Text Interpretation: Normal sinus rhythm Possible Left atrial enlargement Left axis deviation Incomplete right bundle branch block Left ventricular hypertrophy ( R in aVL , Cornell product ) When compared with ECG of 25-Oct-2024 14:40, No significant change was found Confirmed by Gerard Frederick ([id number]) on 11/28/2024 11:08:39 AM    2d echo 10/18/2024 1. Left ventricular ejection fraction, by estimation, is 60 to 65%. The  left ventricle has normal function. The left ventricle has no regional  wall motion abnormalities. There is mild left ventricular hypertrophy.  Left ventricular diastolic parameters  are consistent with Grade I diastolic dysfunction (impaired relaxation).   2. Right ventricular systolic function is normal. The right ventricular  size is normal.   3. The mitral valve is degenerative. Mild mitral valve regurgitation.   4. The aortic valve is tricuspid. Aortic valve regurgitation is not  visualized.    LHC 10/17/2024   CULPRIT LESION #1: Prox LAD to Mid LAD lesion is 90% stenosed with 50% stenosed side branch in 1st Diag. Mid LAD lesion is 100% stenosed, after 1st Diag; TIMI 0 flow in LAD   Angioplasty was performed in the diagonal sidebranch prestent => Post intervention, the side  branch was reduced to 20% residual stenosis.   A drug-eluting stent was successfully placed crossing the diagonal sidebranch, using a STENT ONYX FRONTIER 2.5X26 in the main branch.  Stent was deployed to 2.75 mm  & POT proximally to 3.0 mm. Post intervention, there is a 0% residual stenosis.  TIMI-3 flow restored   LESION #2: 1st Diag lesion is 80% stenosed.   A drug-eluting stent was successfully placed using a STENT ONYX FRONTIER 2.25X12, deployed to 2.4 mm..Post intervention, there is a 0% residual stenosis.  TIMI-3 flow maintained   ------------------------------------   Mid RCA lesion is 60% stenosed.   -----------------------------------   There is mild left ventricular systolic dysfunction. The left ventricular ejection fraction is 45-50% by visual estimate.   LV end diastolic pressure is severely elevated. There is no aortic valve stenosis.   Diagnostic    Dominance: Right  Intervention                                                                              Left Ventriculogram                                               Anteroapical Hypokinesis-EF 45%    Culprit lesion LAD-D1: Severe bifurcation LAD-D1 disease: 100% LAD at D1 with 80% proximal D1 ->  Successful PTCA-DES PCI of the 80% D1 (Onyx Frontier DES 2.25 mm x 12 mm deployed to 2.4 mm) lesion that did not involve the ostium followed by  Successful DES PCI of the LAD (Onyx Frontier DES 2.5 mg x 26 mm deployed to 2.75 mm and postdilated proximally to 3.0 mm)  The ostium of the diagonal branch was not involved. Lesions reduced to 0%. TIMI-3 flow restored. Modest 60% mid RCA stenosis but otherwise no significant disease. EF likely at least 45 to 50% with possible apical anterior hypokinesis and severely elevated LVEDP.    RECOMMENDATIONS: Admit to inpatient ICU.  [facility]   In the absence of any other complications or medical issues, we expect the patient  to be ready for discharge from an interventional cardiology perspective on 10/19/2024.   Will convert from diltiazem to carvedilol  3.125 mg twice daily, add atorvastatin  80 mg daily, and continue ARB Due to elevated LVEDP will treat with 40 mg IV Lasix .  Will check 2D Echocardiogram to better assess EF and filling pressures.   Recommend uninterrupted dual antiplatelet therapy with Aspirin  81mg  daily and Clopidogrel  75mg  daily for a minimum of 12 months (ACS-Class I recommendation).   Anticipate continuing Plavix  long-term     Risk Assessment/Calculations           Physical Exam VS:  BP 118/60 (BP Location: Left Arm, Patient Position: Sitting, Cuff Size: Normal)   Pulse 74 Comment: 84 oximeter  Ht 5' 7 (1.702 m)   Wt 165 lb (74.8 kg)   SpO2 97%   BMI 25.84 kg/m        Wt Readings from Last 3 Encounters:  11/28/24 165 lb (74.8 kg)  10/25/24 169 lb (76.7 kg)  10/17/24 170 lb 3.1 oz (77.2 kg)    GEN: Well nourished, well developed in no acute distress NECK: No JVD; No carotid bruits CARDIAC: RRR, no murmurs, rubs, gallops RESPIRATORY:  Clear to auscultation without rales, wheezing or rhonchi  ABDOMEN: Soft, non-tender, non-distended EXTREMITIES:  No edema; No deformity   ASSESSMENT AND PLAN Coronary artery disease status post anterior lateral STEMI (10/17/2024) with successful PCI/DES to the LAD and first diagonal.  She will need to continue aspirin  81 mg daily indefinitely and clopidogrel  75 mg daily for minimum of 12 months.  She previously been on atorvastatin  80 mg daily which she states was recently decreased by her primary care provider.  She continues to decline cardiac rehab.  EKG today sinus rhythm with a rate of 73 with a right bundle branch block and a left anterior fascicular block with no significant change from prior studies no  further ischemic evaluation is needed at this time.  Nonsustained ventricular tachycardia noted during her hospitalization where she has continued  on carvedilol  3.125 mg twice daily.  Patient continues to deny palpitations and no arrhythmia noted on EKG today.  Primary hypertension with a blood pressure today of 118/60.  Blood pressure has been well-controlled.  She is continued on carvedilol  3.125 mg twice daily.  She has also been encouraged to monitor her pressure 1 to 2 hours postmedication administration at home as well.  Mixed hyperlipidemia with last LDL of 171 with a goal of 70.  She states all of her life that she had had an elevated LDL that her primary care provider had been watching it.  After her cardiovascular event during recent hospitalization she was started on atorvastatin  80 mg daily.  On return today she states that she has been out of her cholesterol medication and her primary care provider advised that the dose was too high and she needed to be on a reduced dose and he reduced her dose to 20 mg daily.  Unfortunately there was no refill of this medication sent to the lab.  After further discussion today the patient is being sent for an updated lipid and LFT to see where her LDL is.  She was advised that we would be more than happy to continue to fill her cholesterol medications but her goal is 70 if she is not at goal we will be willing to reduce her dose to 40 mg daily which is still considered density and part of guideline directed medical therapy but if not she will need to remain on 80 mg to get her LDL down as she does not have a buffer of an elevated HDL.  CKD stage IIIb with last serum creatinine of 1.5.  At she has been encouraged to avoid high potassium foods.  PCP just recently rechecked her kidney function.  She is encouraged to maintain adequate hydration and to avoid NSAIDs.  Hyperkalemia with potassium of 5.7 where she was treated with Lokelma  on an as-needed basis.  Repeat potassium was no longer elevated.  She has been encouraged to continue to avoid high potassium foods.  Bifascicular block with a chronic right  bundle branch block and left anterior fascicular block noted on EKG.  Patient continues to remain asymptomatic we will continue to monitor with surveillance studies.    Cardiac Rehabilitation Eligibility Assessment         Dispo: Patient to return to clinic to see MD/APP in 11 to 12 months or sooner if needed.  Patient has been advised if she needs anything in the meantime to call and we would see her back for a follow-up appointment.  Patient states that she is extremely confused today as to her PCP told her he would handle all of her care as to why she would continue to need to see multiple providers.  She and her family member both been advised that Dr. Cleotilde is there to take care of her overall health and please only concentrate on her heart.  She states that she was advised that she did not have a heart attack this was corrected today and she was explained that she did have a heart attack without lasting heart damage.  Refills on her medications were sent per the patient's request to the pharmacy of choice.  Signed, Doyne Ellinger, NP

## 2024-11-29 LAB — LIPID PANEL
Chol/HDL Ratio: 3.8 ratio (ref 0.0–4.4)
Cholesterol, Total: 139 mg/dL (ref 100–199)
HDL: 37 mg/dL — ABNORMAL LOW (ref >39)
LDL Chol Calc (NIH): 68 mg/dL (ref 0–99)
Triglycerides: 203 mg/dL — ABNORMAL HIGH (ref 0–149)
VLDL Cholesterol Cal: 34 mg/dL (ref 5–40)

## 2024-11-29 LAB — HEPATIC FUNCTION PANEL
ALT: 15 IU/L (ref 0–32)
AST: 24 IU/L (ref 0–40)
Albumin: 4.3 g/dL (ref 3.7–4.7)
Alkaline Phosphatase: 103 IU/L (ref 48–129)
Bilirubin Total: 0.3 mg/dL (ref 0.0–1.2)
Bilirubin, Direct: 0.11 mg/dL (ref 0.00–0.40)
Total Protein: 7 g/dL (ref 6.0–8.5)

## 2024-12-03 ENCOUNTER — Ambulatory Visit: Payer: Self-pay | Admitting: Cardiology

## 2024-12-03 DIAGNOSIS — Z79899 Other long term (current) drug therapy: Secondary | ICD-10-CM

## 2024-12-03 NOTE — Progress Notes (Signed)
 Triglycerides were significantly elevated from previously.  Bad cholesterol or LDL is 68.  Recommend continuing atorvastatin  40 mg daily.  This is half the dose of previously prescribed 80 mg daily.  Recommend repeating lipid and hepatic panel in 3 months after changing the dose to ensure that LDL remains below goal on reduced dosing.

## 2024-12-04 ENCOUNTER — Ambulatory Visit: Admitting: Cardiology
